# Patient Record
Sex: Male | Born: 1945 | Race: White | Hispanic: No | Marital: Married | Smoking: Former smoker
Health system: Southern US, Community
[De-identification: ages and names within clinical notes are randomized; demographics above are authoritative.]

## PROBLEM LIST (undated history)

## (undated) DIAGNOSIS — Z923 Personal history of irradiation: Secondary | ICD-10-CM

## (undated) DIAGNOSIS — C029 Malignant neoplasm of tongue, unspecified: Secondary | ICD-10-CM

## (undated) DIAGNOSIS — C099 Malignant neoplasm of tonsil, unspecified: Secondary | ICD-10-CM

## (undated) HISTORY — DX: Personal history of irradiation: Z92.3

## (undated) HISTORY — PX: GASTROSTOMY TUBE PLACEMENT: SHX655

## (undated) HISTORY — PX: HERNIA REPAIR: SHX51

---

## 2013-04-17 DIAGNOSIS — C099 Malignant neoplasm of tonsil, unspecified: Secondary | ICD-10-CM

## 2013-04-17 HISTORY — DX: Malignant neoplasm of tonsil, unspecified: C09.9

## 2013-05-10 ENCOUNTER — Ambulatory Visit: Payer: Medicare Other | Admitting: Radiation Oncology

## 2013-12-18 ENCOUNTER — Emergency Department (HOSPITAL_COMMUNITY): Payer: Medicare Other

## 2013-12-18 ENCOUNTER — Emergency Department (HOSPITAL_COMMUNITY)
Admission: EM | Admit: 2013-12-18 | Discharge: 2013-12-18 | Disposition: A | Discharge status: Home / Self Care | Payer: Medicare Other | Attending: Emergency Medicine | Admitting: Emergency Medicine

## 2013-12-18 ENCOUNTER — Encounter (HOSPITAL_COMMUNITY): Payer: Self-pay | Admitting: Emergency Medicine

## 2013-12-18 DIAGNOSIS — H6990 Unspecified Eustachian tube disorder, unspecified ear: Secondary | ICD-10-CM | POA: Insufficient documentation

## 2013-12-18 DIAGNOSIS — H6982 Other specified disorders of Eustachian tube, left ear: Secondary | ICD-10-CM

## 2013-12-18 DIAGNOSIS — R221 Localized swelling, mass and lump, neck: Secondary | ICD-10-CM

## 2013-12-18 DIAGNOSIS — K148 Other diseases of tongue: Secondary | ICD-10-CM

## 2013-12-18 DIAGNOSIS — I10 Essential (primary) hypertension: Secondary | ICD-10-CM | POA: Diagnosis not present

## 2013-12-18 DIAGNOSIS — J029 Acute pharyngitis, unspecified: Secondary | ICD-10-CM | POA: Insufficient documentation

## 2013-12-18 DIAGNOSIS — H6980 Other specified disorders of Eustachian tube, unspecified ear: Secondary | ICD-10-CM | POA: Diagnosis not present

## 2013-12-18 DIAGNOSIS — R220 Localized swelling, mass and lump, head: Secondary | ICD-10-CM | POA: Insufficient documentation

## 2013-12-18 DIAGNOSIS — Z87891 Personal history of nicotine dependence: Secondary | ICD-10-CM | POA: Insufficient documentation

## 2013-12-18 NOTE — ED Provider Notes (Signed)
CSN: [id number]     Arrival date & time 12/18/13  0944 History   First MD Initiated Contact with Patient 12/18/13 1024     Chief Complaint  Patient presents with  . Cough  . Sore Throat     (Consider location/radiation/quality/duration/timing/severity/associated sxs/prior Treatment) HPI Comments: Sean Hopkins is a(n) 68 y.o. male who presents to the ED with cc of ear pain, sore throat, and tongue pain and swelling. The patietn noticed swelling at the distal encd of his tongoe on the left side that began 3 weeks ago. He states that the tongue has slowly enlarged and become more painful. He has associated pain with swallowing against the left tonsillar noded. He has had associated intermittent fullness and decreased hearing on the Left which clears with swallowing. He deneis smoking history, He denies hx of htn. No pcp, no meds. He has associated difficulty with speech due to the swellin in his tongue. Denies fevers, chills, fatigue, night sweats, unexplained weight loss. Denies unilateral weakness, facial asymmetry, difficulty with speech, change in gait, or vertigo.     Patient is a 68 y.o. male presenting with pharyngitis.  Sore Throat    History reviewed. No pertinent past medical history. Past Surgical History  Procedure Laterality Date  . Hernia repair     History reviewed. No pertinent family history. History  Substance Use Topics  . Smoking status: Former Research scientist (life sciences)  . Smokeless tobacco: Not on file  . Alcohol Use: Yes     Comment: social    Review of Systems   Ten systems reviewed and are negative for acute change, except as noted in the HPI.     Allergies  Review of patient's allergies indicates no known allergies.  Home Medications   Prior to Admission medications   Medication Sig Start Date End Date Taking? Authorizing Provider  ibuprofen (ADVIL,MOTRIN) 200 MG tablet Take 800 mg by mouth every 6 (six) hours as needed for moderate pain.   Yes Historical Provider,  MD   BP 183/85  Pulse 74  Temp(Src) 98.2 F (36.8 C) (Oral)  Resp 16  SpO2 98% Physical Exam  Nursing note and vitals reviewed. Constitutional: He appears well-developed and well-nourished. No distress.  HENT:  Head: Normocephalic and atraumatic.  TMs normal bilaterally, no mastoid tenderness, no discharge, no auricular adenopathy.  coated tongue, painful swelling, left distal end.  Eyes: Conjunctivae are normal. No scleral icterus.  Neck: Normal range of motion. Neck supple.  Cardiovascular: Normal rate, regular rhythm and normal heart sounds.   Pulmonary/Chest: Effort normal and breath sounds normal. No respiratory distress.  Abdominal: Soft. There is no tenderness.  Musculoskeletal: He exhibits no edema.  Neurological: He is alert.  Skin: Skin is warm and dry. He is not diaphoretic.  Psychiatric: His behavior is normal.    ED Course  Procedures (including critical care time) Labs Review Labs Reviewed - No data to display  Imaging Review Dg Chest 2 View  12/18/2013   CLINICAL DATA:  Sore throat.  Some cough.  EXAM: CHEST  2 VIEW  COMPARISON:  None.  FINDINGS: Cardiac silhouette is normal in size and configuration. Aorta is uncoiled. No mediastinal or hilar masses. No evidence of adenopathy.  Clear lungs.  No pleural effusion or pneumothorax.  Bony thorax is demineralized but intact.  IMPRESSION: No active cardiopulmonary disease.   Electronically Signed   By: Lajean Manes M.D.   On: 12/18/2013 10:30     EKG Interpretation None      MDM  Final diagnoses:  None   10:47 AM BP 183/85  Pulse 74  Temp(Src) 98.2 F (36.8 C) (Oral)  Resp 16  SpO2 98% Patient hypertensive, and swelling at the distal end of the tongue on the left. I have called [facility] Junes and is in lived and have obtained an appointment for this patient at 3:30 PM tomorrow with Dr. Wilburn Cornelia for assessment. Oropharynx is clear and moist. Does not appear to be any signs of abscess or ludwigs  angina. Patient seen in shared visit with attending physician. Discussed return precautions Followup with ear nose and throat regarding tongue mass and pcp regarding hypertension     Margarita Mail, PA-C 12/19/13 [phone]

## 2013-12-18 NOTE — ED Notes (Signed)
Per pt, cough x 1 week.  Earache x 3-4 weeks.  Tongue pain 3-4 weeks.  Pt states when he coughs, he has blood in his sputum.  Lots of mucous.  This started yesterday.  Unknown for fevers.  No n/v.  Eating/drinking like normal.

## 2013-12-18 NOTE — Discharge Instructions (Signed)
Read the instructions below on reasons to return to the emergency department and to learn more about your diagnosis.  Use over the counter medications for symptomatic relief as we discussed (musinex as a decongestant, Tylenol for fever/pain, Motrin/Ibuprofen for muscle aches). If prescribed a cough suppressant during your visit, do not operate heavy machinery with in 5 hours of taking this medication. Followup with your primary care doctor in 4 days if your symptoms persist.  Your more than welcome to return to the emergency department if symptoms worsen or become concerning.  Upper Respiratory Infection, Adult  An upper respiratory infection (URI) is also sometimes known as the common cold. Most people improve within 1 week, but symptoms can last up to 2 weeks. A residual cough may last even longer.   URI is most commonly caused by a virus. Viruses are NOT treated with antibiotics. You can easily spread the virus to others by oral contact. This includes kissing, sharing a glass, coughing, or sneezing. Touching your mouth or nose and then touching a surface, which is then touched by another person, can also spread the virus.   TREATMENT  Treatment is directed at relieving symptoms. There is no cure. Antibiotics are not effective, because the infection is caused by a virus, not by bacteria. Treatment may include:  Increased fluid intake. Sports drinks offer valuable electrolytes, sugars, and fluids.  Breathing heated mist or steam (vaporizer or shower).  Eating chicken soup or other clear broths, and maintaining good nutrition.  Getting plenty of rest.  Using gargles or lozenges for comfort.  Controlling fevers with ibuprofen or acetaminophen as directed by your caregiver.  Increasing usage of your inhaler if you have asthma.  Return to work when your temperature has returned to normal.   SEEK MEDICAL CARE IF:  After the first few days, you feel you are getting worse rather than better.  You  develop worsening shortness of breath, or brown or red sputum. These may be signs of pneumonia.  You develop yellow or brown nasal discharge or pain in the face, especially when you bend forward. These may be signs of sinusitis.  You develop a fever, swollen neck glands, pain with swallowing, or white areas in the back of your throat. These may be signs of strep throat.

## 2013-12-18 NOTE — ED Provider Notes (Signed)
Pt has had painful swelling of his left tongue for the past 2-3 weeks, denies smoking.    Pt has swelling and change in texture of the distal left tongue. See photo.    Medical screening examination/treatment/procedure(s) were conducted as a shared visit with non-physician practitioner(s) and myself.  I personally evaluated the patient during the encounter.   EKG Interpretation None       Rolland Porter, MD, Abram Sander   Janice Norrie, MD 12/18/13 [phone]

## 2013-12-19 NOTE — ED Provider Notes (Signed)
Medical screening examination/treatment/procedure(s) were performed by non-physician practitioner and as supervising physician I was immediately available for consultation/collaboration.   EKG Interpretation None      Rolland Porter, MD, Abram Sander   Janice Norrie, MD 12/19/13 [phone]

## 2013-12-24 ENCOUNTER — Other Ambulatory Visit: Payer: Self-pay | Admitting: Otolaryngology

## 2013-12-24 DIAGNOSIS — K140 Glossitis: Secondary | ICD-10-CM

## 2014-01-04 ENCOUNTER — Ambulatory Visit
Admission: RE | Admit: 2014-01-04 | Discharge: 2014-01-04 | Disposition: A | Discharge status: Home / Self Care | Payer: Medicare Other | Source: Ambulatory Visit | Attending: Otolaryngology | Admitting: Otolaryngology

## 2014-01-04 DIAGNOSIS — K140 Glossitis: Secondary | ICD-10-CM

## 2014-01-04 MED ORDER — IOHEXOL 300 MG/ML  SOLN
75.0000 mL | Freq: Once | INTRAMUSCULAR | Status: AC | PRN
  Administered 2014-01-04: 75 mL via INTRAVENOUS

## 2014-01-23 ENCOUNTER — Emergency Department: Payer: Self-pay | Admitting: Emergency Medicine

## 2014-02-15 HISTORY — PX: OTHER SURGICAL HISTORY: SHX169

## 2014-03-11 ENCOUNTER — Telehealth: Payer: Self-pay | Admitting: *Deleted

## 2014-03-11 NOTE — Telephone Encounter (Signed)
Called pt to introduce myself as the oncology nurse navigator that works with Drs. Isidore Moos and Alvy Bimler, indicated they will be seeing him during Wednesday's [facility] [place].  I explained the [place] format, confirmed his arrival for 1:00, his understanding of [place] location, explained the arrival and registration procedure.  I indicated that I would be joining him during his visit and would tell him more about my role as a member of the Care Team when we meet.  He verbalized understanding of information provided.  Gayleen Orem, RN, BSN, [facility] [place] [phone]

## 2014-03-12 ENCOUNTER — Encounter: Payer: Self-pay | Admitting: Radiation Oncology

## 2014-03-12 NOTE — Progress Notes (Signed)
Head and Neck Cancer Location of Tumor / Histology: oropharynx, Right Lingual Tonsil  Mr. Neng Albee was initially seen By Dr. Wilburn Cornelia, then referred to Dr. Nicolette Bang on 01/23/14.  He states that he has had pain in the anterior tongue for about 1-2 months. There is an ulceration present. This has worsened over this time. No otalgia.Voice change, dysphagia. Difficulty eating/chewing, 20-lb wt loss. No loose teeth, although most have been removed previously. Some numbness of left lower lip. Some bleeding. No palpable neck masses  Biopsy was performed but no malignancy seen. CT done showing suspicious tongue mass and poss contralateral LAD. Also thyroid mass  No family h/o thyroid disease, no radiation exposure.   Biopsies of Right LIngual Tonsil (if applicable) revealed:  RECEIVED: 02/15/2014 ORDERING PHYSICIAN: JOSHUA Tommas Olp , MD PATIENT NAME: Sean Hopkins SURGICAL PATHOLOGY REPORT  FINAL PATHOLOGIC DIAGNOSIS MICROSCOPIC EXAMINATION AND DIAGNOSIS  A. OROPHARYNX, RIGHT LINGUAL TONSIL, BIOPSY: Benign lingual tonsil. No malignancy identified.  B. LYMPH NODES, LEFT LEVEL 2A/3, DISSECTION: No malignancy identified in twenty one lymph nodes (0/21).  C. LYMPH NODES, LEFT LEVEL 2B, DISSECTION: No malignancy identified in eleven lymph nodes (0/11).  D. 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 Greatest dimension of largest positive node 2.5 cm. Negative for extracapsular extension. Benign submandibular gland.  E. SUBMANDIBULAR CONTENTS, RIGHT, EXCISION: Benign submandibular gland with sialolith. No malignancy identified.  F. OROPHARYNX, ANTERIOR TONGUE AND FLOOR OF MOUTH, PARTIAL GLOSSECTOMY: Invasive moderately differentiated squamous cell carcinoma. Greatest dimension 4.5 cm. Positive for perineural invasion. No lymphovascular invasion identified. Margins negative for malignancy. pT3, pN1, pMX. See  protocol below.  G. LYMPH NODES, RIGHT LEVEL 2A/3, DISSECTION: No malignancy identified in nine lymph nodes (0/9).  H. SUBMANDIBULAR CONTENTS, LEFT, DISSECTION: Benign salivary gland with no diagnostic abnormality. No malignancy identified in two lymph nodes (0/2).  GROSS EXAMINATION AND DIAGNOSIS  I. TEETH, GROSS ONLY: Gross examination only (see comment).    Nutrition Status Yes No Comments  Weight changes? [x]  []  20 lb weight loss a of 01/23/14  Swallowing concerns? [x]  []  Dysphagia/difficulty chewing  PEG? []  [x]  Eating soft diet/ 5-7 Cans of Boost daily   Referrals Yes No Comments  Social Work? []  []    Dentistry? []  []    Swallowing therapy? []  []    Nutrition? [x]  []  Dory Peru - 03/13/14  Med/Onc? [x]  []  Dr. Heath Lark -03/13/14   Safety Issues Yes No Comments  Prior radiation? []  [x]    Pacemaker/ICD? []  [x]    Possible current pregnancy? []  [x]    Is the patient on methotrexate? []  [x]     Tobacco/Marijuana/Snuff/ETOH use: Alcohol use: reports that he drinks alcohol. Rare.  Tobacco: The patient quit smoking at age 2 after a 86 pack-year history.   Past/Anticipated interventions by otolaryngology, if any: Bi[facility] - Dr. Theodoro Kalata: Biopsy of Right Lingual Tonsil  Past/Anticipated interventions by medical oncology, if any: Dr. Heath Lark    Current Complaints / other details:  Yellowish residue on tongue No chief complaint on file.

## 2014-03-13 ENCOUNTER — Encounter: Payer: Self-pay | Admitting: Hematology and Oncology

## 2014-03-13 ENCOUNTER — Encounter: Payer: Self-pay | Admitting: *Deleted

## 2014-03-13 ENCOUNTER — Ambulatory Visit: Payer: Medicare Other

## 2014-03-13 ENCOUNTER — Telehealth: Payer: Self-pay | Admitting: *Deleted

## 2014-03-13 ENCOUNTER — Ambulatory Visit: Payer: Medicare Other | Admitting: Hematology and Oncology

## 2014-03-13 ENCOUNTER — Ambulatory Visit: Payer: Medicare Other | Admitting: Nutrition

## 2014-03-13 ENCOUNTER — Encounter: Payer: Self-pay | Admitting: Radiation Oncology

## 2014-03-13 ENCOUNTER — Ambulatory Visit
Admission: RE | Admit: 2014-03-13 | Discharge: 2014-03-13 | Disposition: A | Discharge status: Home / Self Care | Payer: Medicare Other | Source: Ambulatory Visit | Attending: Radiation Oncology | Admitting: Radiation Oncology

## 2014-03-13 ENCOUNTER — Ambulatory Visit: Payer: Medicare Other | Attending: Radiation Oncology | Admitting: Physical Therapy

## 2014-03-13 VITALS — BP 146/58 | HR 79 | Temp 98.0°F | Ht 66.0 in | Wt 139.4 lb

## 2014-03-13 DIAGNOSIS — C029 Malignant neoplasm of tongue, unspecified: Secondary | ICD-10-CM | POA: Insufficient documentation

## 2014-03-13 DIAGNOSIS — C024 Malignant neoplasm of lingual tonsil: Secondary | ICD-10-CM

## 2014-03-13 DIAGNOSIS — M436 Torticollis: Secondary | ICD-10-CM | POA: Insufficient documentation

## 2014-03-13 DIAGNOSIS — C023 Malignant neoplasm of anterior two-thirds of tongue, part unspecified: Secondary | ICD-10-CM

## 2014-03-13 HISTORY — DX: Malignant neoplasm of tonsil, unspecified: C09.9

## 2014-03-13 MED ORDER — FLUCONAZOLE 100 MG PO TABS: ORAL_TABLET | ORAL | Status: DC

## 2014-03-13 NOTE — Progress Notes (Signed)
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 male who presented with Sean Hopkins was initially seen by Dr. Wilburn Cornelia, then referred to Dr. Nicolette Bang on 01/23/14.  He presented with pain in the anterior tongue for about 1-2 months. There is an ulceration present. This has worsened over this time. No otalgia. He reported difficulty eating/chewing, with 20-lb wt loss. No palpable neck masses  Biopsy was performed but no malignancy seen initially. CT done showing suspicious tongue mass and possible contralateral LAD. Also, a thyroid mass (which ultimately was determined not to need biopsy by the radiologists at [facility].)  He subsequently underwent:  01/23/14 [facility] ANTERIOR TONGUE, BIOPSY: Invasive squamous cell carcinoma  10/30 PET showed Intense FDG activity of the floor of the mouth and bilateral suspicious FDG activity in the neck nodes.  Surgery was then performed by Dr Nicolette Bang on 02/15/2014. Tissue graft was taken from left arm for tongue reconstruction.  Pathology revealed:  A. OROPHARYNX, RIGHT LINGUAL TONSIL, BIOPSY: Benign lingual tonsil. No malignancy identified.  B. LYMPH NODES, LEFT LEVEL 2A/3, DISSECTION: No malignancy identified in twenty one lymph nodes (0/21).  C. LYMPH NODES, LEFT LEVEL 2B, DISSECTION: No malignancy identified in eleven lymph nodes (0/11).  D. 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 Greatest dimension of largest positive node 2.5 cm. Negative for extracapsular extension. Benign submandibular gland.  E. SUBMANDIBULAR CONTENTS, RIGHT,  EXCISION: Benign submandibular gland with sialolith. No malignancy identified.  F. OROPHARYNX, ANTERIOR TONGUE AND FLOOR OF MOUTH, PARTIAL GLOSSECTOMY: Invasive moderately differentiated squamous cell carcinoma. Greatest dimension 4.5 cm. Positive for perineural invasion. No lymphovascular invasion identified. Margins negative for malignancy. pT3, pN1, pMX. See protocol below.  G. LYMPH NODES, RIGHT LEVEL 2A/3, DISSECTION: No malignancy identified in nine lymph nodes (0/9).  H. SUBMANDIBULAR CONTENTS, LEFT, DISSECTION: Benign salivary gland with no diagnostic abnormality. No malignancy identified in two lymph nodes (0/2).  GROSS EXAMINATION AND DIAGNOSIS  I. TEETH, GROSS ONLY: Gross examination only (see comment).   Plastic surgery: anticipated for left arm due to grafting. He quit smoking at age 66.  He drinks rare ETOH.  He lives in [place] with his wife and grandchild.  He notes drainage from neck surgical scar, for past week. No fevers.  Few complaints. No dysphagia with liquids, but doesn't try to eat solids. Edentulous.  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    PAST SURGICAL HISTORY: Past Surgical History  Procedure Laterality Date  . Hernia repair    . Tonsillar biopsy Right 02/15/14    Squamous Cell Carcinoma with 1/4 Lymph Nodes Positive    FAMILY HISTORY: family history is not on file.  SOCIAL HISTORY:  reports that he has quit smoking. He does not have any smokeless tobacco history on file. He reports that he drinks alcohol. He reports that he does not use illicit drugs.  ALLERGIES: Review of patient's allergies indicates no known allergies.  MEDICATIONS:  Current Outpatient Prescriptions  Medication Sig Dispense Refill  . aspirin 325 MG tablet Take 325 mg by mouth.    Marland Kitchen HYDROcodone-acetaminophen (  NORCO) 5-325 MG per tablet Take 1 tablet by mouth.    Marland Kitchen ibuprofen (ADVIL,MOTRIN)  200 MG tablet Take 800 mg by mouth every 6 (six) hours as needed for moderate pain.    . Skin Protectants, Misc. (EUCERIN) cream Apply to affected area twice daily    . fluconazole (DIFLUCAN) 100 MG tablet Take 2 tablets today, then 1 tablet daily x 20 more days. 22 tablet 0  . terazosin (HYTRIN) 1 MG capsule Take 1 mg by mouth.     No current facility-administered medications for this encounter.    REVIEW OF SYSTEMS:  Notable for that above.   PHYSICAL EXAM:  height is 5\' 6"  (1.676 m) and weight is 139 lb 6.4 oz (63.231 kg). His temperature is 98 F (36.7 C). His blood pressure is 146/58 and his pulse is 79. His oxygen saturation is 99%.   General: Alert and oriented, in no acute distress HEENT: Head is normocephalic. Extraocular movements are intact. Edentulous. Oropharynx is notable for thrush.  Reconstructed tongue with tissue graft. Neck: No palpable cervical or supraclavicular lymphadenopathy. Culture/swab conducted of yellow drainage from surgical incision over chin.  Induration/erythema of central neck. Trach site closing. Heart: Regular in rate and rhythm with no murmurs, rubs, or gallops. Chest: Clear to auscultation bilaterally, with no rhonchi, wheezes, or rales. Abdomen: Soft, nontender, nondistended, with no rigidity or guarding. Extremities: No cyanosis or edema. Lymphatics: see Neck Exam Skin: No concerning lesions. Musculoskeletal: symmetric strength and muscle tone throughout. Neurologic: Cranial nerves II through XII are grossly intact. No obvious focalities. Speech is fluent. Coordination is intact. Psychiatric: Judgment and insight are intact. Affect is appropriate.  ECOG = 1  0 - Asymptomatic (Fully active, able to carry on all predisease activities without restriction)  1 - Symptomatic but completely ambulatory (Restricted in physically strenuous activity but ambulatory and able to carry out work of a light or sedentary nature. For example, light housework, office  work)  2 - Symptomatic, <50% in bed during the day (Ambulatory and capable of all self care but unable to carry out any work activities. Up and about more than 50% of waking hours)  3 - Symptomatic, >50% in bed, but not bedbound (Capable of only limited self-care, confined to bed or chair 50% or more of waking hours)  4 - Bedbound (Completely disabled. Cannot carry on any self-care. Totally confined to bed or chair)  5 - Death   Eustace Pen MM, Creech RH, Tormey DC, et al. [id number]). "Toxicity and response criteria of the [facility]". [place] Oncol. 5 (6): 649-55   LABORATORY DATA:  No results found for: WBC, HGB, HCT, MCV, PLT CMP  No results found for: NA, K, CL, CO2, GLUCOSE, BUN, CREATININE, CALCIUM, PROT, ALBUMIN, AST, ALT, ALKPHOS, BILITOT, GFRNONAA, GFRAA     PATHOLOGY: 02/15/14 [place]   RECEIVED: 02/15/2014 ORDERING PHYSICIAN: JOSHUA Tommas Olp , MD PATIENT NAME: Sean Hopkins SURGICAL PATHOLOGY REPORT  FINAL PATHOLOGIC DIAGNOSIS MICROSCOPIC EXAMINATION AND DIAGNOSIS  A. OROPHARYNX, RIGHT LINGUAL TONSIL, BIOPSY: Benign lingual tonsil. No malignancy identified.  B. LYMPH NODES, LEFT LEVEL 2A/3, DISSECTION: No malignancy identified in twenty one lymph nodes (0/21).  C. LYMPH NODES, LEFT LEVEL 2B, DISSECTION: No malignancy identified in eleven lymph nodes (0/11).  D. 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 Greatest dimension of largest positive node 2.5 cm. Negative for extracapsular extension. Benign submandibular gland.  E. SUBMANDIBULAR CONTENTS, RIGHT, EXCISION: Benign submandibular gland  with sialolith. No malignancy identified.  F. OROPHARYNX, ANTERIOR TONGUE AND FLOOR OF MOUTH, PARTIAL GLOSSECTOMY: Invasive moderately differentiated squamous cell carcinoma. Greatest dimension 4.5 cm. Positive for perineural invasion. No lymphovascular invasion  identified. Margins negative for malignancy. pT3, pN1, pMX. See protocol below.  G. LYMPH NODES, RIGHT LEVEL 2A/3, DISSECTION: No malignancy identified in nine lymph nodes (0/9).  H. SUBMANDIBULAR CONTENTS, LEFT, DISSECTION: Benign salivary gland with no diagnostic abnormality. No malignancy identified in two lymph nodes (0/2).  GROSS EXAMINATION AND DIAGNOSIS  I. TEETH, GROSS ONLY: Gross examination only (see comment).   COMMENT: I. In order to release the pathology report, part "I" will be examined and any additional information will be provided as an addendum (see below).   LIP AND ORAL CANCER PROTOCOL  SPECIMEN: Anterior two-thirds of tongue, NOS Floor of mouth, NOS RECEIVED: Fresh PROCEDURE: Resection Glossectomy: Partial (anterior) SPECIMEN SIZE: Greatest dimensions: 6.5 x 5.3 x 3.8 cm SPECIMEN LATERALITY: Midline TUMOR SITE: Lateral border of tongue TUMOR FOCALITY: Single focus TUMOR SIZE: Greatest dimension: 4.5 cm Additional dimensions: 2.6 x 2.5 cm HISTOLOGIC TYPE: Squamous cell carcinoma, conventional HISTOLOGIC GRADE: G2 MARGINS: Margins uninvolved by invasive carcinoma TREATMENT EFFECT: Not identified LYMPH-VASCULAR INVASION: Not identified PERINEURAL INVASION: Present LYMPH NODES, EXTRANODAL EXTENSION: Not identified PATHOLOGIC STAGING (pTNM) (Note I) PRIMARY TUMOR (pT) Excluding Mucosal Malig Melanoma: pT3 REG LYMPH NODES (pN) Excluding Mucosal Malig Melanoma: pN1  01/23/14 [place] ANTERIOR TONGUE, BIOPSY: Invasive squamous cell carcinoma   RADIOGRAPHY: No results found.   PET at [facility] on 02-08-14:   Intense FDG activity of the floor of the mouth a patient with a diagnosis of squamous cell carcinoma of the tongue.  FDG avid right level Ib, and left level 2 lymph nodes concerning for metastatic disease.  There is low level activity in the jugular chains bilaterally at levels 2/3, deep to the SCM, right greater than left. This FDG activity  level is greater than that of blood pool, therefore the activity may represent disease in small nodes.  Intense focal uptake of FDG in the prostate eccentrically left to the prostatic urethra. Differential considerations include a TURP defect versus prostate carcinoma. Recommend correlation with surgical history and PSA value.   There is also circumferential mural thickening of the rectum, and the patient may benefit from digital rectal exam.  Hyperdense lesion arising from the upper pole of the right kidney without associated FDG activity. This finding may represent a hemorrhagic cyst, but is incompletely evaluated. Recommend further evaluation with MR abdomen.  IMPRESSION/PLAN: This is a delightful 68 year old man with stage III squamous cell carcinoma of the oral tongue, pT3N1M0, with PNI.  He has a very distant smoking history. The patient is a good candidate for adjuvant radiotherapy. The patient has been discussed in detail at tumor board and seen in the context of multidisciplinary clinic today. Plan is as below:   1) Today in multidisciplinary clinic the patient will see Polo Riley from social work for social support  2 Today in multidisciplinary clinic he will see nutrition for nutrition support - I anticipate PEG tube.   3) I will refer to IR for PEG tube placement. He is thin, and risk of malnutrition during mouth/ neck RT is significant.  4) Will refer to swallowing therapy for evaluation and prophylactic treatment as needed for dysphagia, which can worsen during or after chemoradiotherapy.   5) Physical therapist will see patient today in [facility] clinic for neck measurements due to risk of lymphedema in neck; may benefit from  PT for this after completion of radiotherapy. The patient also may benefit from PT for potentional deconditioning after treatment    6) Simulation scheduled for 12/7. Anticipate 6 weeks of RT - 60 Gy in 30 fractions.   7) Baseline TSH lab due to risk of  hypothyroidism from RT. Baseline BMP, CBC.  8) Fluconazole Rx'd for thrush  9) Culture/swab of yellow drainage from surgical incision over chin.  Induration/erythema of neck.  Gayleen Orem, RN, our Head and Neck Oncology Navigator called Dr. Servando Salina office to request that pt be seen this week in his clinic for suspected infection.  10) Need to obtain outside PET images for tx planning   It was a pleasure meeting the patient today. We discussed the risks, benefits, and side effects of radiotherapy.   We talked in detail about acute and late effects. He understands that some of the most bothersome acute effects will be significant soreness of the mouth and throat, changes in taste, changes in salivary function, skin irritation, hair loss, dehydration, weight loss and fatigue. We talked about late effects which include but are not necessarily limited to dysphagia, hypothyroidism, dry mouth, trismus, neck edema and nerve or spinal cord injury. No guarantees of treatment were given. A consent form was signed and placed in the patient's medical record. The patient is enthusiastic about proceeding with treatment. I look forward to participating in the patient's care.  _________________________________________   Eppie Gibson, MD

## 2014-03-13 NOTE — Progress Notes (Signed)
As requested by Dr. Isidore Moos, obtained culture from the underside of Sean Hopkins chin(beard) which demonstrated yellowish/opaque drainage.  Cleansed the area with sterile saline and note a small raise, raw, reddened area.  Culture taken to lab. Given sterile saline and steril aguze to cleanse the area

## 2014-03-13 NOTE — Therapy (Signed)
[place], [place], [address] Phone: [phone]   Fax:  [phone]  Physical Therapy Evaluation  Patient Details  Name: Sean Hopkins MRN: [id number] Date of Birth: 1945-05-02  Encounter Date: 03/13/2014      PT End of Session - 03/13/14 1359    Visit Number 1   Number of Visits 1   Date for PT Re-Evaluation 05/12/14   PT Start Time 1240   PT Stop Time 1310   PT Time Calculation (min) 30 min      Past Medical History  Diagnosis Date  . 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 Hernia repair    . 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  Visit Diagnosis:  Stiffness of neck - Plan: PT plan of care cert/re-cert      Subjective Assessment - 03/13/14 1340    Symptoms Still with some neck tightness post neck dissection surgery.   Pertinent History Left tongue inflamed squamous mucosa; oral tongue resection 02/15/14 at [facility]; neck lymph node dissection with approx. 20 nodes removed; one contralateral submental node positive, no lymphovascular invasion, but perineural invasion.         Currently in Pain? No/denies          [facility] PT Assessment - 03/13/14 0001    Assessment   Medical Diagnosis s/p tongue and lymph node dissection   Next MD Visit meets with radiation oncologist today   Precautions   Precautions Other (comment)   Precaution Comments cancer precautions; may have a neck infection   Required Braces or Orthoses Other Brace/Splint   Other Brace/Splint wearing a splint on his left hand and forearm which he reports is for donor sites for his tongue reconstruction; expects to have this removed next week   Restrictions   Weight Bearing Restrictions No   Balance Screen   Has the patient fallen in the past 6 months No   Has the patient had a decrease in  activity level because of a fear of falling?  No   Is the patient reluctant to leave their home because of a fear of falling?  No   Home Environment   Living Enviornment Private residence   Living Arrangements Spouse/significant other  also 36 year-old grandchild at home   Type of [place] Two level  denies difficulty with negotiating stairs   Prior Function   Level of Independence Independent with basic ADLs;Independent with gait   Observation/Other Assessments   Skin Integrity upper neck appears reddened and there is a discharge from the chin; appears to have swelling currently above the line of his neck dissection incision   Functional Tests   Functional tests Sit to Stand   Sit to Stand   Comments performs 12 reps in 30 seconds   AROM   Overall AROM  Deficits   Overall AROM Comments Active left shoulder flexion and abduction approx. 20 degrees less than right (WFL on right)   Cervical Flexion WFL   Cervical Extension 25% loss   Cervical - Right Side Bend 25% loss   Cervical - Left Side Bend 25% loss   Cervical - Right Rotation 25% loss   Cervical - Left Rotation 25% loss   Strength   Overall Strength Within functional limits for tasks performed   Overall  Strength Comments but patient reports feeling he is still weak post surgery            PT Education - March 27, 2014 1359    Education provided Yes   Education Details cervical ROM, posture, walking, lymphedema info for head and neck cancer pre-radiation treatment   Person(s) Educated Patient   Methods Explanation;Handout   Comprehension Verbalized understanding              Plan - March 27, 2014 1400    Clinical Impression Statement Pt. s/p neck dissection and to undergo radiation treatment with current neck AROM limitations and risk of lymphedema may benefit from therapy if these conditions persist/occur with radiation treatment.   Pt will benefit from skilled therapeutic intervention in order to improve  on the following deficits Increased edema;Decreased range of motion   Rehab Potential Good   PT Frequency One time visit   PT Treatment/Interventions Patient/family education;Therapeutic exercise   PT Next Visit Plan None at this time; reassess if lymphedema or other limitations develop.   Consulted and Agree with Plan of Care Patient          G-Codes - 03/27/2014 1403    Functional Assessment Tool Used clinical judgement   Functional Limitation Changing and maintaining body position   Changing and Maintaining Body Position Current Status [phone]) At least 20 percent but less than 40 percent impaired, limited or restricted   Changing and Maintaining Body Position Goal Status (E4235) At least 1 percent but less than 20 percent impaired, limited or restricted            LYMPHEDEMA/ONCOLOGY QUESTIONNAIRE - 03-27-14 1356    Type   Cancer Type neck   Surgeries   Other Surgery Date 02/15/14  tongue resection and reconstruction; neck node dissection   Number Lymph Nodes Removed 20   Treatment   Active Radiation Treatment No  will start radiation treatment soon   Lymphedema Assessments   Lymphedema Assessments Head and Neck   Head and Neck   4 cm superior to sternal notch around neck 36.5 cm   6 cm superior to sternal notch around neck 36.1 cm   8 cm superior to sternal notch around neck 38 cm   Other 10 cm. superior to sternal notch, 39.3 cm.                         Head and Neck Clinic Goals - Mar 27, 2014 1403    Patient will be able to verbalize understanding of a home exercise program for cervical range of motion, posture, and walking.    Status Achieved   Patient will be able to verbalize understanding of proper sitting and standing posture.    Status Achieved   Patient will be able to verbalize understanding of lymphedema risk and availability of treatment for this condition.    Status Achieved       Problem List Patient Active Problem  List   Diagnosis Date Noted  . Tongue cancer March 27, 2014    SALISBURY,DONNA 2014/03/27, 2:07 PM     SALISBURY,DONNA, PT

## 2014-03-13 NOTE — Telephone Encounter (Signed)
Spoke with Roanna Epley at Dr. Tressa Busman office.  Reported that during patient's visit with Dr. Isidore Moos today, it was noted patient experiencing surgical site redness with some discharge that was cultured.  I expressed Dr. Pearlie Oyster concern that patient should be seen this week for further assessment.  Roanna Epley stated that she would forward information to Dr. Servando Salina RN who would contact pt to arrange appt.  Gayleen Orem, RN, BSN, [place] at [place] [phone]

## 2014-03-13 NOTE — Progress Notes (Addendum)
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  He is a patient of Dr. Isidore Moos.  He is to receive radiation therapy.  Past medical history includes tobacco and alcohol use.  Medications include Norco.  Labs were not available.  Height: 66 inches. Weight: 139.4 pounds. Usual body weight: 140 pounds per patient.Marland Kitchen BMI: 22.5.  Patient reports he has no difficulty chewing if he consume soft foods.   Patient does have poor dentition.   Patient reports he has lost weight however, reports usual body weight of 140 pounds.   Patient does not drink milk nor does he like ensure or boost.  Patient states he makes himself drink oral nutrition supplements.  Nutrition diagnosis:  Food and nutrition related knowledge deficit related to new diagnosis of tonsil cancer as evidenced by no prior need for nutrition related information.  Intervention: Patient educated to consume high-calorie, high-protein foods in small amounts throughout the day. Provided fact sheet on increasing calories and protein. Provided coupons for oral nutrition supplements and encouraged patient to try some other brands. Provided recipes fact sheets for patient and encouraged patient use oral nutrition supplements to prepare milkshakes. Teach back method was used. Contact information was provided.  Monitoring, evaluation, goals: Patient will tolerate adequate calories and protein to minimize weight loss throughout treatment.  Next visit: To be scheduled with radiation therapy.  **Disclaimer: This note was dictated with voice recognition software. Similar sounding words can inadvertently be transcribed and this note may contain transcription errors which may not have been corrected upon publication of note.**

## 2014-03-13 NOTE — Progress Notes (Signed)
Met with patient during [place] today: 1. Further explained my role as his navigator. 2. Provided New Patient Information packet:  Contact information for physician and navigator  Advance Directive information ([place] blue pamphlet)  Fall Prevention Patient Safety Plan  [facility] campus map with highlight of [place] 3. Provided preliminary education re: RT, including showing of mask and explanation of its purpose during RT. 4. Supported Dr. Pearlie Oyster discussion of feeding tube, provided him educational handout. 5. Provided tour of CT SIM and [place], explained procedure for arrival/prepartion for RT. He verbalized understanding of information provided.  He understands he can contact met at any time with questions/concerns.  Gayleen Orem, RN, BSN, [place] at [place] [phone]

## 2014-03-13 NOTE — Telephone Encounter (Signed)
Called patient to see if he had any questions prior to his arrival for [facility].  He denied.  I reminded him of his 12:00 arrival time, reviewed the arrival/registration process.  He verbalized understanding.  Gayleen Orem, RN, BSN, [place] at [place] [phone]

## 2014-03-14 NOTE — Progress Notes (Signed)
Head & Neck Multidisciplinary Clinic Clinical Social Work  Clinical Social Work met with patient at head & neck multidisciplinary clinic to offer support and assess for psychosocial needs.  Mr. Brusca reported he had no concerns at today's visit.  The patient shared his brother could provide transportation to appointments if necessary.  Mr. Tokunaga was unable to identify his support system at this time.   Clinical Social Work briefly discussed Clinical Social Work role and [employer] support programs/services.  Clinical Social Work encouraged patient to call with any additional questions or concerns.   Polo Riley, MSW, LCSW, OSW-C Clinical Social Worker [facility] [phone]

## 2014-03-15 ENCOUNTER — Telehealth: Payer: Self-pay | Admitting: *Deleted

## 2014-03-15 NOTE — Telephone Encounter (Signed)
CALLED PATIENT TO INFORM OF APPT. WITH [facility] [place] ON 04-01-14 - ARRIVAL TIME - 1:45 PM @ [facility] , LVM FOR A RETURN CALL

## 2014-03-16 LAB — WOUND CULTURE

## 2014-03-18 ENCOUNTER — Telehealth: Payer: Self-pay | Admitting: *Deleted

## 2014-03-18 ENCOUNTER — Ambulatory Visit
Admission: RE | Admit: 2014-03-18 | Discharge: 2014-03-18 | Disposition: A | Discharge status: Home / Self Care | Payer: Medicare Other | Source: Ambulatory Visit | Attending: Radiation Oncology | Admitting: Radiation Oncology

## 2014-03-18 VITALS — BP 144/66 | HR 79 | Temp 97.8°F | Ht 66.0 in | Wt 137.4 lb

## 2014-03-18 DIAGNOSIS — Z510 Encounter for antineoplastic radiation therapy: Secondary | ICD-10-CM | POA: Insufficient documentation

## 2014-03-18 DIAGNOSIS — C029 Malignant neoplasm of tongue, unspecified: Secondary | ICD-10-CM | POA: Diagnosis not present

## 2014-03-18 MED ORDER — SODIUM CHLORIDE 0.9 % IJ SOLN
10.0000 mL | Freq: Once | INTRAMUSCULAR | Status: AC
  Administered 2014-03-18: 10 mL via INTRAVENOUS

## 2014-03-18 NOTE — Progress Notes (Signed)
Patient  gave name and dob as identification, s/p Ct Simulation completed,patient tolerated well, stated; d/c Iv catheter right arm, catheter tip intact, placed gause over site,help pressure 2 minutes, gave instructions  To leave dresing over site a couple hours,gave bandaid to replace gause later, patient gave verbal understanding  11:29 AM

## 2014-03-18 NOTE — Telephone Encounter (Signed)
Called Dr. Servando Salina office at [facility] and spoke to [place].  Relayed the results of the wound culture of the posterior chin region and also faxed a copy of the report as requested at 1612.

## 2014-03-18 NOTE — Progress Notes (Signed)
Mr. Sean Hopkins here today for IV start prior to CT simulation.  He denies any contrast reaction in the past and is not a diabetic.  His last BUN and Creat. Are 15 and 0.62 respectively as of 02/18/14 at [facility].  IV start in right ventral surface with a 22 gauge angiocath with brisk blood return.  No voiced complaints.  Escorted to simulation at 10:45am

## 2014-03-18 NOTE — Progress Notes (Signed)
Simulation, IMRT treatment planning note   Outpatient  Diagnosis:    ICD-9-CM ICD-10-CM   1. 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 An Aquaplast head and shoulder mask was custom fitted to the patient's anatomy. High-resolution CT axial imaging was obtained of the head and neck with contrast. I verified that the quality of the imaging is good for treatment planning. 1 Medically Necessary Treatment Device was fabricated and supervised by me: Aquaplast mask.   Treatment planning note I plan to treat the patient with helical Tomotherapy, IMRT. I plan to treat the patient's tumor bed and bilateral neck nodes. I plan to treat to a total dose of 60 Gray in 30  Fractions. Dose calculation was ordered from dosimetry.  IMRT planning Note  IMRT is an important modality to deliver adequate dose to the patient's at risk tissues while sparing the patient's normal structures, including the: esophagus, parotid tissue, mandible, brain stem, spinal cord, oral cavity, brachial plexus.  This justifies the use of IMRT in the patient's treatment.      -----------------------------------  Eppie Gibson, MD

## 2014-03-19 ENCOUNTER — Telehealth: Payer: Self-pay | Admitting: *Deleted

## 2014-03-19 NOTE — Telephone Encounter (Signed)
Called patient to see if he had any questions s/p his visit with Dr. Isidore Moos last week; he denied.  We discussed his hesitation to have feeding tube placed, "I don't like the idea".  I explained the benefits, rationale for having tube placed before starting RT, timeframe for its removal.  I asked if he would like to speak with a former patient who had a feeding tube, he accepted offer.  I replied that I would get back to him with the name of the patient who will be calling him.  Gayleen Orem, RN, BSN, [place] at [place] [phone]

## 2014-03-19 NOTE — Telephone Encounter (Signed)
Called patient to let him know he can expect a call from former patient Rochele Raring with whom he can share his questions and concerns about having a feeding tube placed.  Gayleen Orem, RN, BSN, [place] at [place] [phone]

## 2014-03-22 ENCOUNTER — Telehealth: Payer: Self-pay | Admitting: *Deleted

## 2014-03-22 NOTE — Telephone Encounter (Signed)
Called patient s/p his call from former patient who shared his experience of having a feeding tube.  Sean Hopkins indicated to me he is "still not sure" about having one placed.  I confirmed his understanding of 11/16 0800 start date for his RT.  Gayleen Orem, RN, BSN, [place] at [place] [phone]

## 2014-03-25 DIAGNOSIS — Z510 Encounter for antineoplastic radiation therapy: Secondary | ICD-10-CM | POA: Diagnosis not present

## 2014-03-26 ENCOUNTER — Telehealth: Payer: Self-pay | Admitting: *Deleted

## 2014-03-26 NOTE — Telephone Encounter (Addendum)
Returned call from Sean Hopkins.  He reports that he has increased swelling in his posterior jaw and he does not want to start radiation until he is seen by Dr. Nicolette Bang.  Suggested he be seen by Dr. Wilburn Cornelia who was the original physician that saw him prior to referral to Dr. Nicolette Bang.   Dr. Wilburn Cornelia is able to see him today at 3:40pm, but Sean Hopkins states he would prefer to see Dr. Nicolette Bang, therefore, waiting on return call from Dr. Servando Salina office and will call Sean Hopkins with an update.

## 2014-03-27 ENCOUNTER — Encounter: Payer: Medicare Other | Admitting: Nutrition

## 2014-03-27 ENCOUNTER — Telehealth: Payer: Self-pay | Admitting: *Deleted

## 2014-03-27 ENCOUNTER — Ambulatory Visit: Admission: RE | Admit: 2014-03-27 | Payer: Medicare Other | Source: Ambulatory Visit | Admitting: Radiation Oncology

## 2014-03-27 DIAGNOSIS — Z510 Encounter for antineoplastic radiation therapy: Secondary | ICD-10-CM | POA: Diagnosis not present

## 2014-03-27 NOTE — Telephone Encounter (Signed)
Called patient, he confirmed he will be here tomorrow to start RT.  Dr. Isidore Moos and [facility] informed.  Gayleen Orem, RN, BSN, [place] at [place] [phone]

## 2014-03-28 ENCOUNTER — Ambulatory Visit
Admission: RE | Admit: 2014-03-28 | Discharge: 2014-03-28 | Disposition: A | Discharge status: Home / Self Care | Payer: Medicare Other | Source: Ambulatory Visit | Attending: Radiation Oncology | Admitting: Radiation Oncology

## 2014-03-28 DIAGNOSIS — C029 Malignant neoplasm of tongue, unspecified: Secondary | ICD-10-CM

## 2014-03-28 DIAGNOSIS — Z510 Encounter for antineoplastic radiation therapy: Secondary | ICD-10-CM | POA: Diagnosis not present

## 2014-03-28 MED ORDER — BIAFINE EX EMUL
Freq: Two times a day (BID) | CUTANEOUS | Status: DC
  Administered 2014-03-28: 11:00:00 via TOPICAL

## 2014-03-28 NOTE — Progress Notes (Signed)
SKIN CARE DURING RADIATION TREATMENT-HEAD AND NECK  RECOMMENDATIONS: ? Use unscented soap (Dove) ? When showering it is fine for water to touch the area, but please avoid direct spray on the treatment field.  Also, wash inside and around the marked area ? When drying gently blot the area ? Avoid using lotions, oils or powders as well as products with alcohol ? If you shave, use an electric razor and DO NOT use pre or after shave lotion  ? Moisturizer o You may be given Radiaplex Gel or Biafine (provided by nursing) to use. Apply twice daily, once after treatment and then again prior to bedtime o Your Radiation Oncologist may suggest other skin care products ? PLEASE DO NOT APPLY ANYTHING TO THE TREATMENT AREA SKIN WITH 4 HOURS  PRIOR TO RADIATION  ? Mouth care o Soothing relief: rinse your mouth ever 1-2 hours with a solution of  teaspoon baking soda and 1/8 teaspoon salt mixed in 1 cup of warm water o DO NOT use mouthwashes that contain alcohol, try using BIOTENE instead

## 2014-03-28 NOTE — Progress Notes (Signed)
Pt here for Patient teaching.  Pt given Radiation and You booklet with areas of pertinence flagged and highlighted.  Reviewed fatigue, hair loss, mouth and throat changes, mouth and skin care.  Pt given Biafine cream with instructions of how to apply and frequency. Given skin care information sheet with mouth rinse recipe (baking soda and salt combo).  Pt able to given pt teach back of how to apply biafine, to use an electric razor if shaving and keep mouth moist.  Pt states he feels comfortable with the information and if he has any questions or concerns to contact nursing.

## 2014-03-29 ENCOUNTER — Ambulatory Visit
Admission: RE | Admit: 2014-03-29 | Discharge: 2014-03-29 | Disposition: A | Discharge status: Home / Self Care | Payer: Medicare Other | Source: Ambulatory Visit | Attending: Radiation Oncology | Admitting: Radiation Oncology

## 2014-03-29 DIAGNOSIS — Z510 Encounter for antineoplastic radiation therapy: Secondary | ICD-10-CM | POA: Diagnosis not present

## 2014-03-29 NOTE — Progress Notes (Signed)
Cleansed around would with normal saline and sterile q tip.  No drainage noted.  Inserted 1/4 " iodoform guaze moistened with sterile normal saline into small wound under his chin.  Patient did not want the wound with packing covered.  He has supplies to do packing changes over the weekend at home.

## 2014-03-29 NOTE — Progress Notes (Signed)
IMRT Device Note    ICD-9-CM ICD-10-CM   1. 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 The code is [phone].  -----------------------------------  Eppie Gibson, MD

## 2014-04-01 ENCOUNTER — Ambulatory Visit: Payer: Medicare Other | Admitting: Nutrition

## 2014-04-01 ENCOUNTER — Ambulatory Visit: Payer: Medicare Other

## 2014-04-01 ENCOUNTER — Encounter: Payer: Self-pay | Admitting: Radiation Oncology

## 2014-04-01 ENCOUNTER — Ambulatory Visit
Admission: RE | Admit: 2014-04-01 | Discharge: 2014-04-01 | Disposition: A | Discharge status: Home / Self Care | Payer: Medicare Other | Source: Ambulatory Visit | Attending: Radiation Oncology | Admitting: Radiation Oncology

## 2014-04-01 VITALS — BP 127/74 | HR 82 | Temp 98.0°F | Resp 12 | Wt 138.7 lb

## 2014-04-01 DIAGNOSIS — C023 Malignant neoplasm of anterior two-thirds of tongue, part unspecified: Secondary | ICD-10-CM | POA: Insufficient documentation

## 2014-04-01 DIAGNOSIS — Z510 Encounter for antineoplastic radiation therapy: Secondary | ICD-10-CM | POA: Diagnosis not present

## 2014-04-01 DIAGNOSIS — R1310 Dysphagia, unspecified: Secondary | ICD-10-CM

## 2014-04-01 DIAGNOSIS — M436 Torticollis: Secondary | ICD-10-CM | POA: Diagnosis not present

## 2014-04-01 NOTE — Progress Notes (Signed)
He is currently in no pain. Pt complains of, Fatigue.  Pt denies dysphagia. PO Diet: Regular, soft. Oral exam reveals dry mouth, noted multiple "hair-like" lines on tongue surface, pt reports thick saliva. Pt reports pain in Right ear when swallowing rating it a 10, pain is aching and radiates down right side of neck. Skin is pink over post trach site.  Reports soft regular bowel movements, every other day.

## 2014-04-01 NOTE — Progress Notes (Signed)
Brief follow-up with patient after radiation and M.D. appointment. Patient continues to report some difficulty chewing and swallowing. Reports he is drinking 5-6 oral nutrition supplements daily.  He typically will make a shake using other ingredients. Weight is stable and documented as 138.7 pounds on December 21.  Nutrition diagnosis: Food and nutrition related knowledge deficit improved.  Intervention:  Patient educated to continue high-calorie high-protein foods throughout the day.   Patient should continue oral nutrition supplement shakes for weight maintenance. Provided samples and coupons. Teach back method used.  Monitoring, evaluation, goals.  Patient is tolerating adequate calories and protein for weight maintenance.  Next visit: Wednesday, December 30 after radiation treatment.  **Disclaimer: This note was dictated with voice recognition software. Similar sounding words can inadvertently be transcribed and this note may contain transcription errors which may not have been corrected upon publication of note.**

## 2014-04-01 NOTE — Progress Notes (Signed)
   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  CBCT/MVCT images/Port film x-rays were reviewed.  The chart was checked. Doing well.  Did not pack his uppper neck wound this weekend.  It appeared to close up. Has cleansed it with peroxide.  Physical Findings:  weight is 138 lb 11.2 oz (62.914 kg). His oral temperature is 98 F (36.7 C). His blood pressure is 127/74 and his pulse is 82. His respiration is 12 and oxygen saturation is 100%.   No sign of tumor recurrence in mouth or neck.  his uppper neck wound appeared to close up with no drainage.  CBC No results found for: WBC, RBC, HGB, HCT, PLT, MCV, MCH, MCHC, RDW, LYMPHSABS, MONOABS, EOSABS, BASOSABS   CMP  No results found for: NA, K, CL, CO2, GLUCOSE, BUN, CREATININE, CALCIUM, PROT, ALBUMIN, AST, ALT, ALKPHOS, BILITOT, GFRNONAA, GFRAA  No results found for: TSH  Impression:  The patient is tolerating radiotherapy.   Plan:  Continue radiotherapy as planned.    -----------------------------------  Eppie Gibson, MD

## 2014-04-01 NOTE — Therapy (Signed)
[facility] [address] [place], [place], [address] Phone: [phone]   Fax:  [phone]  Speech Language Pathology Evaluation  Patient Details  Name: Sean Hopkins MRN: [id number] Date of Birth: May 15, 1945  Encounter Date: 04/01/2014      End of Session - 04/01/14 1641    Visit Number 1   Number of Visits 6   Date for SLP Re-Evaluation 06/01/14   SLP Start Time 1405   SLP Stop Time  1449   SLP Time Calculation (min) 44 min   Activity Tolerance Patient tolerated treatment well      Past Medical History  Diagnosis Date  . 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 Hernia repair    . 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  Visit Diagnosis: Dysphagia - Plan: SLP plan of care cert/re-cert      Subjective Assessment - 04/01/14 1641    Symptoms Pt reports [facility] SLP provided exercises for speech intelligibility. Pt did not definitely know/recall frequency or duration of those exercises ("10 times a day, or something like that").    Currently in Pain? No/denies   Pain Score 0-No pain     Pt currently tolerates approx 1 cup soft food x3/day, with 5-6 nutritional liquid supplements. Reportedly pt is gaining weight. Oral motor assessment revealed minimal/absent lingual ROM and reduced lingual strength. Labial ROM was WNL and strength was WNL. Velar ROM appeared WNL. POs: Pt ate applesauce and drank H2O without overt s/s aspiration. Thyroid elevation appeared adequate, and swallows appeared somewhat timely. Oral residue noted as minimal following additional swallow with applesauce. Pt's swallow deemed at least functional at this time, without evidence of aspiration today during evaluation tasks.   Because data states the risk for dysphagia during and after radiation treatment is high  due to undergoing radiation tx, SLP taught pt about the possibility of reduced/limited ability for PO intake during rad tx. SLP encouraged pt to continue swallowing POs as far into rad tx as possible, even ingesting POs and/or completing HEP shortly after administration of pain meds.   SLP educated pt re: changes to swallowing musculature after rad tx, and why adherence to dysphagia HEP provided today and PO consumption was necessary to inhibit muscular disuse atrophy and to reduce muscle fibrosis following rad tx. Pt demonstrated understanding of these things to SLP. Further education was provided re: xerostomia, mucositis/esophagitis, and late effects head/neck radiation.   After evaluation tasks, SLP then developed a HEP for pt, and pt was instructed how to perform exercises involving lingual, vocal, and pharyngeal strengthening. SLP performed each exercise and pt return demonstrated each exercise. SLP ensured pt performance was correct prior to moving on to next exercise. Pt was instructed to complete this program 2-3 times a day, 6-7 days/week until 60 days after their last rad tx, then x3 a week after that.   Pt's speech intelligibilty was approx 90% in conversation, and inconsistently improved with SLP request for pt to repeat. Pt was encouraged to reduce his speech rate in order to improve intelligibility. Pt told SLP that [place] had encouraged him to focus on slower more deliberate speech to improve intelligibility.       SLP Education - 04/01/14 1640    Education provided Yes   Education Details HEP for swallowing, muscle fibrosis and atrophy, mucositis/esophagitis,  xerostomia   Person(s) Educated Patient   Methods Explanation;Demonstration;Handout   Comprehension Verbalized understanding;Returned demonstration          SLP Short Term Goals - Apr 19, 2014 1644    SLP SHORT TERM GOAL #1   Title pt will complete swallowing HEP with rare min A   Time 1   Period --  visit   Status  New   SLP SHORT TERM GOAL #2   Title pt will tell SLP why he is completing HEP for swallowing   Time 1   Period --  visit   Status New          SLP Long Term Goals - April 19, 2014 1644    SLP LONG TERM GOAL #1   Title pt will complete swallowing HEP with modified independence   Time 2   Period --  visits   Status New   SLP LONG TERM GOAL #2   Title pt will tell benefits of keeping a food journal in order to return to full PO diet   Time 2   Period --  visits   Status New   SLP LONG TERM GOAL #3   Title pt will tell SLP 3 s/s aspiration PNA with modified independence   Time 2   Period --  visits   Status New          Plan - 04/19/14 1642    Clinical Impression Statement Pt presents with swallowing essentially  WNL. He is compensating quite well for reconstruction of anterior tongue, with minimal protrstion/lateralization of lingual musculature.   Speech Therapy Frequency Monthly   Duration --  approx every 4 weeks x60 days   Treatment/Interventions Aspiration precaution training;Pharyngeal strengthening exercises;Oral motor exercises;Patient/family education;SLP instruction and feedback   Potential to [place] pt provided HEP today and agreed to complete as prescribed   Consulted and Agree with Plan of Care Patient          G-Codes - 04/19/14 1647    Functional Assessment Tool Used noms   Functional Limitations Swallowing   Swallow Current Status (W2993) At least 1 percent but less than 20 percent impaired, limited or restricted   Swallow Goal Status (Z1696) At least 1 percent but less than 20 percent impaired, limited or restricted      Problem List Patient Active Problem List   Diagnosis Date Noted  . Carcinoma of anterior two-thirds of tongue 2014-04-19  . Tongue cancer 03/13/2014    [facility] 04-19-14, 4:49 PM  [place] [address] [place], [place], [address] Phone: [phone]   Fax:  [phone]

## 2014-04-01 NOTE — Patient Instructions (Signed)
SWALLOWING EXERCISES Do these 6 of the 7 days per week until your last day of radiation, then 3 times per week afterwards  1. Effortful Swallows - Squeeze hard with the muscles in your neck while you swallow your  saliva or a sip of water - Repeat 15-20 times, 2-3 times a day, and use whenever you eat or drink  2. Masako Swallow - swallow with your tongue sticking out - Stick tongue out as far as you can and hold that position while you swallow - Repeat 15-20 times, 2-3 times a day *use a wet spoon if your mouth gets dry*  3. Shaker Exercise - head lift - Lie flat on your back in your bed or on a couch without pillows - Raise your head and look at your feet - KEEP YOUR SHOULDERS DOWN - HOLD FOR 45-60 SECONDS, then lower your head back down - Repeat 3 times, 2 times a day  4. Mendelsohn Maneuver - "half swallow" exercise - Start to swallow, and keep your Adam's apple up by squeezing hard with the muscles of the throat - Hold the squeeze for 5-7 seconds and then relax - Repeat 15-20 times, 2-3 times a day *use a wet spoon if your mouth gets dry*  5. Breath Hold - Say "HUH!" loudly, then hold your breath for 3 seconds at your voice box - Repeat 20 times, 2-3 times a day  6. Chin pushback - Open your mouth  - Place your fist UNDER your chin near your neck, and push back with your fist for 5 seconds - Repeat 8-10 times, 2-3 times a day  Pt was provided a paper copy of these exercises on 04-01-14

## 2014-04-02 ENCOUNTER — Ambulatory Visit
Admission: RE | Admit: 2014-04-02 | Discharge: 2014-04-02 | Disposition: A | Discharge status: Home / Self Care | Payer: Medicare Other | Source: Ambulatory Visit | Attending: Radiation Oncology | Admitting: Radiation Oncology

## 2014-04-02 DIAGNOSIS — Z510 Encounter for antineoplastic radiation therapy: Secondary | ICD-10-CM | POA: Diagnosis not present

## 2014-04-03 ENCOUNTER — Encounter: Payer: Self-pay | Admitting: *Deleted

## 2014-04-03 ENCOUNTER — Ambulatory Visit
Admission: RE | Admit: 2014-04-03 | Discharge: 2014-04-03 | Disposition: A | Discharge status: Home / Self Care | Payer: Medicare Other | Source: Ambulatory Visit | Attending: Radiation Oncology | Admitting: Radiation Oncology

## 2014-04-03 DIAGNOSIS — Z510 Encounter for antineoplastic radiation therapy: Secondary | ICD-10-CM | POA: Diagnosis not present

## 2014-04-03 NOTE — Progress Notes (Signed)
To provide support and encouragement, care continuity and to assess for needs, met with patient before his Tomo tmt.  He reported that he has been tolerating tmts.   He did not express any needs or concerns at this time, I encouraged him to contact me if that changes, he verbalized understanding.  Gayleen Orem, RN, BSN, [place] at [place] [phone]

## 2014-04-04 ENCOUNTER — Ambulatory Visit
Admission: RE | Admit: 2014-04-04 | Discharge: 2014-04-04 | Disposition: A | Discharge status: Home / Self Care | Payer: Medicare Other | Source: Ambulatory Visit | Attending: Radiation Oncology | Admitting: Radiation Oncology

## 2014-04-04 DIAGNOSIS — Z510 Encounter for antineoplastic radiation therapy: Secondary | ICD-10-CM | POA: Diagnosis not present

## 2014-04-08 ENCOUNTER — Encounter: Payer: Self-pay | Admitting: Radiation Oncology

## 2014-04-08 ENCOUNTER — Ambulatory Visit
Admission: RE | Admit: 2014-04-08 | Discharge: 2014-04-08 | Disposition: A | Discharge status: Home / Self Care | Payer: Medicare Other | Source: Ambulatory Visit | Attending: Radiation Oncology | Admitting: Radiation Oncology

## 2014-04-08 VITALS — BP 137/64 | HR 84 | Resp 16 | Wt 138.6 lb

## 2014-04-08 DIAGNOSIS — Z510 Encounter for antineoplastic radiation therapy: Secondary | ICD-10-CM | POA: Diagnosis not present

## 2014-04-08 DIAGNOSIS — C029 Malignant neoplasm of tongue, unspecified: Secondary | ICD-10-CM

## 2014-04-08 NOTE — Progress Notes (Signed)
Patient reports right ear pain 2 on a scale of 0-10 that radiates down the right side of his neck. Denies dysphagia. Denies difficulty swallowing. Reports a good appetite. Explains he is eating soft foods. Dry mouth reported. Thrush noted. Patient prescribed diflucan. Trach site closed with pink dry skin noted. Tiny wound under center of chin closed. Reports soft regular bowel movements every other day. Weight and vitals stable.

## 2014-04-08 NOTE — Progress Notes (Signed)
Weekly Management Note:  Site: Anterior tongue/bilateral neck Current Dose:  1400  cGy Projected Dose: 6000  cGy  Narrative: The patient is seen today for routine under treatment assessment. CBCT/MVCT images/port films were reviewed. The chart was reviewed.   He is doing generally well but does have continued right ear discomfort which began on initiation of radiation therapy.  He is not on any chemotherapy.  His appetite is good.  His weight is stable.  Physical Examination:  Filed Vitals:   04/08/14 1057  BP: 137/64  Pulse: 84  Resp: 16  .  Weight: 138 lb 9.6 oz (62.869 kg).  There is no palpable adenopathy in the neck.  On inspection of the oral cavity and oropharynx there is no obvious candidiasis.  Of interest is that there is hair along his right oral cavity reconstruction from his left forearm graft.  Impression: Tolerating radiation therapy well.  Plan: Continue radiation therapy as planned.

## 2014-04-09 ENCOUNTER — Ambulatory Visit
Admission: RE | Admit: 2014-04-09 | Discharge: 2014-04-09 | Disposition: A | Discharge status: Home / Self Care | Payer: Medicare Other | Source: Ambulatory Visit | Attending: Radiation Oncology | Admitting: Radiation Oncology

## 2014-04-09 DIAGNOSIS — Z510 Encounter for antineoplastic radiation therapy: Secondary | ICD-10-CM | POA: Diagnosis not present

## 2014-04-10 ENCOUNTER — Encounter: Payer: Medicare Other | Admitting: Nutrition

## 2014-04-10 ENCOUNTER — Ambulatory Visit
Admission: RE | Admit: 2014-04-10 | Discharge: 2014-04-10 | Disposition: A | Discharge status: Home / Self Care | Payer: Medicare Other | Source: Ambulatory Visit | Attending: Radiation Oncology | Admitting: Radiation Oncology

## 2014-04-10 DIAGNOSIS — Z510 Encounter for antineoplastic radiation therapy: Secondary | ICD-10-CM | POA: Diagnosis not present

## 2014-04-11 ENCOUNTER — Ambulatory Visit
Admission: RE | Admit: 2014-04-11 | Discharge: 2014-04-11 | Disposition: A | Discharge status: Home / Self Care | Payer: Medicare Other | Source: Ambulatory Visit | Attending: Radiation Oncology | Admitting: Radiation Oncology

## 2014-04-11 DIAGNOSIS — Z510 Encounter for antineoplastic radiation therapy: Secondary | ICD-10-CM | POA: Diagnosis not present

## 2014-04-15 ENCOUNTER — Encounter: Payer: Self-pay | Admitting: Radiation Oncology

## 2014-04-15 ENCOUNTER — Ambulatory Visit
Admission: RE | Admit: 2014-04-15 | Discharge: 2014-04-15 | Disposition: A | Discharge status: Home / Self Care | Payer: Medicare Other | Source: Ambulatory Visit | Attending: Radiation Oncology | Admitting: Radiation Oncology

## 2014-04-15 VITALS — BP 143/65 | HR 79 | Temp 97.8°F | Resp 20 | Wt 137.5 lb

## 2014-04-15 DIAGNOSIS — C023 Malignant neoplasm of anterior two-thirds of tongue, part unspecified: Secondary | ICD-10-CM

## 2014-04-15 DIAGNOSIS — Z510 Encounter for antineoplastic radiation therapy: Secondary | ICD-10-CM | POA: Diagnosis not present

## 2014-04-15 MED ORDER — LIDOCAINE VISCOUS 2 % MT SOLN: OROMUCOSAL | Status: AC

## 2014-04-15 NOTE — Progress Notes (Signed)
Patient denies pain, difficulty eating, swallowing. He states he has been fatigued since surgery. He sees dr at [facility] and has appointment tomorrow. He has dressing on left forearm donor site. He states he just completed antibiotic for healing of the site. Pt has not begun applying Biafine cream to neck area; advised he begin twice daily.

## 2014-04-15 NOTE — Progress Notes (Signed)
   Weekly Management Note:  Outpatient    ICD-9-CM ICD-10-CM   1. Carcinoma of anterior two-thirds of tongue 141.4 C02.3 lidocaine (XYLOCAINE) 2 % solution    Current Dose:  22 Gy  Projected Dose: 70 Gy   Narrative:  The patient presents for routine under treatment assessment.  CBCT/MVCT images/Port film x-rays were reviewed.  The chart was checked. No new complaints. Denies soreness, denies drainage from neck.  Physical Findings:  weight is 137 lb 8 oz (62.37 kg). His temperature is 97.8 F (36.6 C). His blood pressure is 143/65 and his pulse is 79. His respiration is 20.  no discharge from small hole in upper midline neck.  No mucositis or thrush  CBC No results found for: WBC, RBC, HGB, HCT, PLT, MCV, MCH, MCHC, RDW, LYMPHSABS, MONOABS, EOSABS, BASOSABS   CMP  No results found for: NA, K, CL, CO2, GLUCOSE, BUN, CREATININE, CALCIUM, PROT, ALBUMIN, AST, ALT, ALKPHOS, BILITOT, GFRNONAA, GFRAA   Impression:  The patient is tolerating radiotherapy.   Plan:  Continue radiotherapy as planned. Lidocaine Rx for mucositis in future given. Baking soda gargle recipe provided  -----------------------------------  Eppie Gibson, MD

## 2014-04-16 ENCOUNTER — Ambulatory Visit
Admission: RE | Admit: 2014-04-16 | Discharge: 2014-04-16 | Disposition: A | Discharge status: Home / Self Care | Payer: Medicare Other | Source: Ambulatory Visit | Attending: Radiation Oncology | Admitting: Radiation Oncology

## 2014-04-16 DIAGNOSIS — Z510 Encounter for antineoplastic radiation therapy: Secondary | ICD-10-CM | POA: Diagnosis not present

## 2014-04-17 ENCOUNTER — Encounter: Payer: Medicare Other | Admitting: Nutrition

## 2014-04-17 ENCOUNTER — Ambulatory Visit
Admission: RE | Admit: 2014-04-17 | Discharge: 2014-04-17 | Disposition: A | Discharge status: Home / Self Care | Payer: Medicare Other | Source: Ambulatory Visit | Attending: Radiation Oncology | Admitting: Radiation Oncology

## 2014-04-17 DIAGNOSIS — Z510 Encounter for antineoplastic radiation therapy: Secondary | ICD-10-CM | POA: Diagnosis not present

## 2014-04-18 ENCOUNTER — Ambulatory Visit
Admission: RE | Admit: 2014-04-18 | Discharge: 2014-04-18 | Disposition: A | Discharge status: Home / Self Care | Payer: Medicare Other | Source: Ambulatory Visit | Attending: Radiation Oncology | Admitting: Radiation Oncology

## 2014-04-18 DIAGNOSIS — Z510 Encounter for antineoplastic radiation therapy: Secondary | ICD-10-CM | POA: Diagnosis not present

## 2014-04-19 ENCOUNTER — Ambulatory Visit
Admission: RE | Admit: 2014-04-19 | Discharge: 2014-04-19 | Disposition: A | Discharge status: Home / Self Care | Payer: Medicare Other | Source: Ambulatory Visit | Attending: Radiation Oncology | Admitting: Radiation Oncology

## 2014-04-19 DIAGNOSIS — Z510 Encounter for antineoplastic radiation therapy: Secondary | ICD-10-CM | POA: Diagnosis not present

## 2014-04-22 ENCOUNTER — Ambulatory Visit
Admission: RE | Admit: 2014-04-22 | Discharge: 2014-04-22 | Disposition: A | Discharge status: Home / Self Care | Payer: Medicare Other | Source: Ambulatory Visit | Attending: Radiation Oncology | Admitting: Radiation Oncology

## 2014-04-22 VITALS — BP 143/68 | HR 86 | Temp 97.6°F | Resp 20 | Wt 136.2 lb

## 2014-04-22 DIAGNOSIS — Z510 Encounter for antineoplastic radiation therapy: Secondary | ICD-10-CM | POA: Diagnosis not present

## 2014-04-22 DIAGNOSIS — C023 Malignant neoplasm of anterior two-thirds of tongue, part unspecified: Secondary | ICD-10-CM

## 2014-04-22 NOTE — Progress Notes (Signed)
   Weekly Management Note:  Outpatient    ICD-9-CM ICD-10-CM   1. Carcinoma of anterior two-thirds of tongue 141.4 C02.3     Current Dose:32 Gy  Projected Dose: 70 Gy   Narrative:  The patient presents for routine under treatment assessment.  CBCT/MVCT images/Port film x-rays were reviewed.  The chart was checked Patient denies pain, loss of appetite, difficulty eating, swallowing. He states he is fatigued, but it is his baseline from before beginning treatment. His neck treatment area is pink with dryness of skin; he is not applying lotion, stating his skin is not irritated.  He is not needing lidocaine for his mouth as it's not sore, yet.  Physical Findings:  weight is 136 lb 3.2 oz (61.78 kg). His oral temperature is 97.6 F (36.4 C). His blood pressure is 143/68 and his pulse is 86. His respiration is 20.  no discharge from small hole in upper midline neck.  No mucositis or thrush. Skin in pink, dry.  CBC No results found for: WBC, RBC, HGB, HCT, PLT, MCV, MCH, MCHC, RDW, LYMPHSABS, MONOABS, EOSABS, BASOSABS   CMP  No results found for: NA, K, CL, CO2, GLUCOSE, BUN, CREATININE, CALCIUM, PROT, ALBUMIN, AST, ALT, ALKPHOS, BILITOT, GFRNONAA, GFRAA   Impression:  The patient is tolerating radiotherapy.   Plan:  Continue radiotherapy as planned. Lidocaine Rx for mucositis in future , PRN. Baking soda gargles PRN. Biafine over skin.  -----------------------------------  Eppie Gibson, MD

## 2014-04-22 NOTE — Progress Notes (Signed)
Patient denies pain, loss of appetite, difficulty eating, swallowing. He states he is fatigued, but it is his baseline from before beginning treatment. His neck treatment area is pink with dryness of skin; he is not applying lotion, stating his skin is not irritated. Advised he begin to add moisture to this area.

## 2014-04-23 ENCOUNTER — Ambulatory Visit
Admission: RE | Admit: 2014-04-23 | Discharge: 2014-04-23 | Disposition: A | Discharge status: Home / Self Care | Payer: Medicare Other | Source: Ambulatory Visit | Attending: Radiation Oncology | Admitting: Radiation Oncology

## 2014-04-23 DIAGNOSIS — Z510 Encounter for antineoplastic radiation therapy: Secondary | ICD-10-CM | POA: Diagnosis not present

## 2014-04-24 ENCOUNTER — Ambulatory Visit
Admission: RE | Admit: 2014-04-24 | Discharge: 2014-04-24 | Disposition: A | Discharge status: Home / Self Care | Payer: Medicare Other | Source: Ambulatory Visit | Attending: Radiation Oncology | Admitting: Radiation Oncology

## 2014-04-24 ENCOUNTER — Encounter: Payer: Medicare Other | Admitting: Nutrition

## 2014-04-24 DIAGNOSIS — Z510 Encounter for antineoplastic radiation therapy: Secondary | ICD-10-CM | POA: Diagnosis not present

## 2014-04-25 ENCOUNTER — Ambulatory Visit
Admission: RE | Admit: 2014-04-25 | Discharge: 2014-04-25 | Disposition: A | Discharge status: Home / Self Care | Payer: Medicare Other | Source: Ambulatory Visit | Attending: Radiation Oncology | Admitting: Radiation Oncology

## 2014-04-25 DIAGNOSIS — Z510 Encounter for antineoplastic radiation therapy: Secondary | ICD-10-CM | POA: Diagnosis not present

## 2014-04-26 ENCOUNTER — Ambulatory Visit
Admission: RE | Admit: 2014-04-26 | Discharge: 2014-04-26 | Disposition: A | Discharge status: Home / Self Care | Payer: Medicare Other | Source: Ambulatory Visit | Attending: Radiation Oncology | Admitting: Radiation Oncology

## 2014-04-26 ENCOUNTER — Encounter: Payer: Self-pay | Admitting: *Deleted

## 2014-04-26 DIAGNOSIS — Z510 Encounter for antineoplastic radiation therapy: Secondary | ICD-10-CM | POA: Diagnosis not present

## 2014-04-26 NOTE — Progress Notes (Signed)
To provide support and encouragement, care continuity and to assess for needs, met with patient prior to Tomo tmt. Neck appeared dry, skin somewhat flaky.  In response to my inquiry, stated he is applying Biafine daily before bedtime.  I encouraged BID application to maintain skin integrity.  He verbalized understanding. He denied concerns/needs, understands he can contact me.  Gayleen Orem, RN, BSN, [place] at [place] [phone]

## 2014-04-29 ENCOUNTER — Ambulatory Visit
Admission: RE | Admit: 2014-04-29 | Discharge: 2014-04-29 | Disposition: A | Discharge status: Home / Self Care | Payer: Medicare Other | Source: Ambulatory Visit | Attending: Radiation Oncology | Admitting: Radiation Oncology

## 2014-04-29 ENCOUNTER — Encounter: Payer: Self-pay | Admitting: Radiation Oncology

## 2014-04-29 DIAGNOSIS — Z510 Encounter for antineoplastic radiation therapy: Secondary | ICD-10-CM | POA: Diagnosis not present

## 2014-04-29 DIAGNOSIS — B370 Candidal stomatitis: Secondary | ICD-10-CM | POA: Insufficient documentation

## 2014-04-29 DIAGNOSIS — C023 Malignant neoplasm of anterior two-thirds of tongue, part unspecified: Secondary | ICD-10-CM

## 2014-04-29 MED ORDER — BIAFINE EX EMUL
Freq: Two times a day (BID) | CUTANEOUS | Status: DC
  Administered 2014-04-29: 11:00:00 via TOPICAL

## 2014-04-29 MED ORDER — FLUCONAZOLE 100 MG PO TABS: ORAL_TABLET | ORAL | Status: DC

## 2014-04-29 NOTE — Progress Notes (Signed)
Patient reports mouth and tip of tongue soreness, has yellow patches on tongue indicative of thrush. Pt states he has had thrush in the past. He drinks 3-5 cans of Ensure or Boost daily, eats soft foods. He has seen nutritionist, next appt 05/08/14. He states foods do not taste good. He has lost 4 lbs in past week. Advised he try drinking supplements through straw to avoid his tongue. Pt denies fatigue beyond than his baseline. He is applying Biafine to neck treatment area, dry desquamation, erythema. Gave him new tube.

## 2014-04-29 NOTE — Progress Notes (Signed)
   Weekly Management Note:  Outpatient    ICD-9-CM ICD-10-CM   1. Carcinoma of anterior two-thirds of tongue 141.4 C02.3 topical emolient (BIAFINE) emulsion     fluconazole (DIFLUCAN) 100 MG tablet    Current Dose: 42 Gy  Projected Dose: 70 Gy   Narrative:  The patient presents for routine under treatment assessment.  CBCT/MVCT images/Port film x-rays were reviewed.  The chart was checked. Mouth a little sore -- nursing noted thrush.  No other complaints.   He has lost 4 lbs over the past week.  Vitals are otherwise stable.  Physical Findings:  weight is 132 lb 3.2 oz (59.966 kg). His oral temperature is 97.2 F (36.2 C). His blood pressure is 136/64 and his pulse is 87. His respiration is 20.  no discharge from small hole in upper midline neck.    Skin in pink, dry. Thrush over tongue.  CBC No results found for: WBC, RBC, HGB, HCT, PLT, MCV, MCH, MCHC, RDW, LYMPHSABS, MONOABS, EOSABS, BASOSABS   CMP  No results found for: NA, K, CL, CO2, GLUCOSE, BUN, CREATININE, CALCIUM, PROT, ALBUMIN, AST, ALT, ALKPHOS, BILITOT, GFRNONAA, GFRAA   Impression:  The patient is tolerating radiotherapy.   Plan:  Continue radiotherapy as planned. Lidocaine Rx for mucositis in future , PRN. Baking soda gargles PRN. Biafine over skin.  Fluconazole Rx for thrush. Nutrition appt next week. -----------------------------------  Eppie Gibson, MD

## 2014-04-30 ENCOUNTER — Ambulatory Visit
Admission: RE | Admit: 2014-04-30 | Discharge: 2014-04-30 | Disposition: A | Discharge status: Home / Self Care | Payer: Medicare Other | Source: Ambulatory Visit | Attending: Radiation Oncology | Admitting: Radiation Oncology

## 2014-04-30 DIAGNOSIS — Z510 Encounter for antineoplastic radiation therapy: Secondary | ICD-10-CM | POA: Diagnosis not present

## 2014-05-01 ENCOUNTER — Ambulatory Visit
Admission: RE | Admit: 2014-05-01 | Discharge: 2014-05-01 | Disposition: A | Discharge status: Home / Self Care | Payer: Medicare Other | Source: Ambulatory Visit | Attending: Radiation Oncology | Admitting: Radiation Oncology

## 2014-05-01 DIAGNOSIS — Z510 Encounter for antineoplastic radiation therapy: Secondary | ICD-10-CM | POA: Diagnosis not present

## 2014-05-02 ENCOUNTER — Ambulatory Visit
Admission: RE | Admit: 2014-05-02 | Discharge: 2014-05-02 | Disposition: A | Discharge status: Home / Self Care | Payer: Medicare Other | Source: Ambulatory Visit | Attending: Radiation Oncology | Admitting: Radiation Oncology

## 2014-05-02 DIAGNOSIS — Z510 Encounter for antineoplastic radiation therapy: Secondary | ICD-10-CM | POA: Diagnosis not present

## 2014-05-03 ENCOUNTER — Ambulatory Visit
Admission: RE | Admit: 2014-05-03 | Discharge: 2014-05-03 | Disposition: A | Discharge status: Home / Self Care | Payer: Medicare Other | Source: Ambulatory Visit | Attending: Radiation Oncology | Admitting: Radiation Oncology

## 2014-05-03 DIAGNOSIS — Z510 Encounter for antineoplastic radiation therapy: Secondary | ICD-10-CM | POA: Diagnosis not present

## 2014-05-06 ENCOUNTER — Ambulatory Visit
Admission: RE | Admit: 2014-05-06 | Discharge: 2014-05-06 | Disposition: A | Discharge status: Home / Self Care | Payer: Medicare Other | Source: Ambulatory Visit | Attending: Radiation Oncology | Admitting: Radiation Oncology

## 2014-05-06 VITALS — BP 137/73 | HR 89 | Temp 97.9°F | Resp 18 | Wt 132.2 lb

## 2014-05-06 DIAGNOSIS — Z510 Encounter for antineoplastic radiation therapy: Secondary | ICD-10-CM | POA: Diagnosis not present

## 2014-05-06 DIAGNOSIS — C023 Malignant neoplasm of anterior two-thirds of tongue, part unspecified: Secondary | ICD-10-CM

## 2014-05-06 NOTE — Progress Notes (Signed)
   Weekly Management Note:  Outpatient    ICD-9-CM ICD-10-CM   1. Carcinoma of anterior two-thirds of tongue 141.4 C02.3     Current Dose: 52 Gy  Projected Dose: 60 Gy   Narrative:  The patient presents for routine under treatment assessment.  CBCT/MVCT images/Port film x-rays were reviewed.  The chart was checked. Patient denies pain, difficulty eating/swallowing. He drinks 3-5 nutritional supplements daily, states he has loss of appetite, no energy.  He is applying Biafine to neck treatment area for erythema, dry desquamation. He continues to take Diflucan.    Physical Findings:  weight is 132 lb 3.2 oz (59.966 kg). His temperature is 97.9 F (36.6 C). His blood pressure is 137/73 and his pulse is 89. His respiration is 18.    Skin in pink, dry. Ritta Slot has resolved over tongue. Confluent mucositis over anterior tongue graft.  CBC No results found for: WBC, RBC, HGB, HCT, PLT, MCV, MCH, MCHC, RDW, LYMPHSABS, MONOABS, EOSABS, BASOSABS   CMP  No results found for: NA, K, CL, CO2, GLUCOSE, BUN, CREATININE, CALCIUM, PROT, ALBUMIN, AST, ALT, ALKPHOS, BILITOT, GFRNONAA, GFRAA   Impression:  The patient is tolerating radiotherapy.   Plan:  Continue radiotherapy as planned.  He will complete treatments on Fri; gave him 1 month FU card (sooner f/u if needed).  He knows to call if any new issues arise.  -----------------------------------  Eppie Gibson, MD

## 2014-05-06 NOTE — Progress Notes (Signed)
Patient denies pain, difficulty eating/swallowing. He drinks 3-5 nutritional supplements daily, states he has loss of appetite, no energy. He is applying Biafine to neck treatment area for erythema, dry desquamation. He continues to take Diflucan.  He will complete treatments on Fri; gave him 1 month FU card.

## 2014-05-07 ENCOUNTER — Ambulatory Visit
Admission: RE | Admit: 2014-05-07 | Discharge: 2014-05-07 | Disposition: A | Discharge status: Home / Self Care | Payer: Medicare Other | Source: Ambulatory Visit | Attending: Radiation Oncology | Admitting: Radiation Oncology

## 2014-05-07 DIAGNOSIS — Z510 Encounter for antineoplastic radiation therapy: Secondary | ICD-10-CM | POA: Diagnosis not present

## 2014-05-08 ENCOUNTER — Ambulatory Visit
Admission: RE | Admit: 2014-05-08 | Discharge: 2014-05-08 | Disposition: A | Discharge status: Home / Self Care | Payer: Medicare Other | Source: Ambulatory Visit | Attending: Radiation Oncology | Admitting: Radiation Oncology

## 2014-05-08 ENCOUNTER — Encounter: Payer: Medicare Other | Admitting: Nutrition

## 2014-05-08 ENCOUNTER — Encounter: Payer: Self-pay | Admitting: Nutrition

## 2014-05-08 DIAGNOSIS — Z510 Encounter for antineoplastic radiation therapy: Secondary | ICD-10-CM | POA: Diagnosis not present

## 2014-05-08 NOTE — Progress Notes (Signed)
Patient did not show up for scheduled nutrition appointment.  He finishes treatment this week.

## 2014-05-09 ENCOUNTER — Ambulatory Visit
Admission: RE | Admit: 2014-05-09 | Discharge: 2014-05-09 | Disposition: A | Discharge status: Home / Self Care | Payer: Medicare Other | Source: Ambulatory Visit | Attending: Radiation Oncology | Admitting: Radiation Oncology

## 2014-05-09 DIAGNOSIS — Z510 Encounter for antineoplastic radiation therapy: Secondary | ICD-10-CM | POA: Diagnosis not present

## 2014-05-10 ENCOUNTER — Ambulatory Visit
Admission: RE | Admit: 2014-05-10 | Discharge: 2014-05-10 | Disposition: A | Discharge status: Home / Self Care | Payer: Medicare Other | Source: Ambulatory Visit | Attending: Radiation Oncology | Admitting: Radiation Oncology

## 2014-05-10 ENCOUNTER — Encounter: Payer: Self-pay | Admitting: Radiation Oncology

## 2014-05-10 DIAGNOSIS — Z510 Encounter for antineoplastic radiation therapy: Secondary | ICD-10-CM | POA: Diagnosis not present

## 2014-05-17 ENCOUNTER — Telehealth: Payer: Self-pay | Admitting: *Deleted

## 2014-05-17 NOTE — Telephone Encounter (Signed)
Called patient to check on his well-being since completing RT on 05/10/14. 1. He reported:  Continuing fatigue.  Absence of pain.  Eating and drinking though has to force himself b/c "tastes terrible".  We discussed that these are normal SEs of RT, that they will resolve gradually as he is further out from end of RT. 2. Continues to apply Biafine twice daily.  I encouraged him to apply more frequently as needed. 3. He confirmed understanding of 2/24 11:40 appt with Dr. Isidore Moos. 4. He understands he can contact me with needs/concerns.  Gayleen Orem, RN, BSN, [facility] [phone]

## 2014-05-20 NOTE — Progress Notes (Signed)
  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 Site/dose:   Tongie and bilateral neck / 60 Gy in 30 fractions to higher risk regions, 54 Gy in 30 fractions to intermediate risk regions  Beams/energy:   Helical IMRT / 6 MV photons  Narrative: The patient tolerated radiation treatment relatively well.      Plan: The patient has completed radiation treatment. The patient will return to radiation oncology clinic for routine followup in one month. I advised them to call or return sooner if they have any questions or concerns related to their recovery or treatment.  -----------------------------------  Eppie Gibson, MD

## 2014-05-28 ENCOUNTER — Encounter: Payer: Self-pay | Admitting: *Deleted

## 2014-06-04 ENCOUNTER — Encounter: Payer: Self-pay | Admitting: *Deleted

## 2014-06-05 ENCOUNTER — Ambulatory Visit
Admission: RE | Admit: 2014-06-05 | Discharge: 2014-06-05 | Disposition: A | Discharge status: Home / Self Care | Payer: Medicare Other | Source: Ambulatory Visit | Attending: Radiation Oncology | Admitting: Radiation Oncology

## 2014-06-05 ENCOUNTER — Encounter: Payer: Self-pay | Admitting: Radiation Oncology

## 2014-06-05 VITALS — BP 146/77 | HR 62 | Temp 98.2°F | Resp 20 | Ht 66.0 in | Wt 127.1 lb

## 2014-06-05 DIAGNOSIS — C023 Malignant neoplasm of anterior two-thirds of tongue, part unspecified: Secondary | ICD-10-CM

## 2014-06-05 DIAGNOSIS — R634 Abnormal weight loss: Secondary | ICD-10-CM

## 2014-06-05 NOTE — Progress Notes (Signed)
Radiation Oncology         [phone] ________________________________  Name: Sean Hopkins MRN: [id number]  Date: 06/05/2014  DOB: 1945/08/28  Follow-Up Visit Note  CC: No primary care provider on file.  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  He has minimal complaints.  He recently saw Dr. Nicolette Bang (NED per his note in [place]) and plans to see him again in 2 months.   Eating is not enjoyable due to dysguesia but he is forcing himself to do so and drinks plenty of fluid.                      ALLERGIES:  has No Known Allergies.  Meds: Current Outpatient Prescriptions  Medication Sig Dispense Refill  . aspirin 325 MG tablet Take 325 mg by mouth.    . chlorhexidine (PERIDEX) 0.12 % solution Take 15 mLs by mouth.    Marland Kitchen HYDROcodone-acetaminophen (NORCO) 5-325 MG per tablet Take 1 tablet by mouth every 6 (six) hours as needed for moderate pain.     . fluconazole (DIFLUCAN) 100 MG tablet Take 2 tablets today, then 1 tablet daily x 20 more days. (Patient not taking: Reported on 06/05/2014) 22 tablet 0  . ibuprofen (ADVIL,MOTRIN) 200 MG tablet Take 800 mg by mouth every 6 (six) hours as needed for moderate pain.    Marland Kitchen lidocaine (XYLOCAINE) 2 % solution Patient: Mix 1part 2% viscous lidocaine, 1part H20. Swish and/or swallow 86mL of this mixture, 20min before meals and at bedtime, up to QID (Patient not taking: Reported on 06/05/2014) 100 mL 5  . Skin Protectants, Misc. (EUCERIN) cream Apply to affected area twice daily    . terazosin (HYTRIN) 1 MG capsule Take 1 mg by mouth.     No current facility-administered medications for this encounter.    Physical Findings: The patient is in no acute distress. Patient is alert and oriented. Wt Readings from Last 3 Encounters:  05/06/14 132 lb 3.2 oz (59.966 kg)  04/22/14 136 lb 3.2 oz  (61.78 kg)  03/13/14 139 lb 6.4 oz (63.231 kg)    height is 5\' 6"  (1.676 m) and weight is 127 lb 1.6 oz (57.652 kg). His oral temperature is 98.2 F (36.8 C). His blood pressure is 146/77 and his pulse is 62. His respiration is 20 and oxygen saturation is 100%. . Oropharyngeal mucosa is intact with no thrush or lesions. No palpable cervical or supraclavicular lymphadenopathy. Skin intact and smooth over neck.    Lab Findings: No results found for: WBC, HGB, HCT, MCV, PLT  No results found for: TSH  Radiographic Findings: No results found.  Impression/Plan:    1) Head and Neck Cancer Status: healing well with NED  2) Nutritional Status: - weight: slightly decreased.  Patient offered re-referral to nutritionist but declines.  Will push PO intake - PEG tube: none  3) Risk Factors: The patient has been educated about risk factors including alcohol and tobacco abuse; they understand that avoidance of alcohol and tobacco is important to prevent recurrences as well as other cancers  4) Swallowing: functional, no active issues  5) Dental: edentulous  6) Thyroid function: check TSH in 34mo  7) Social: No active social issues to address at this time   8) Other:  n/a  9) Follow-up in 4 months; Dr Nicolette Bang sees him  in 2 mo and eventually plans to order his imaging so I will defer to him on that.  The patient was encouraged to call with any issues or questions before then.  _____________________________________   Eppie Gibson, MD

## 2014-06-05 NOTE — Progress Notes (Signed)
Follow up s/p rad txs  03/28/14-05/10/14, neck well healed,still dry areas, not using any cream, no c/o difficulty swallowing, but has no taste, says"I eat to Live", no pain or nausea, , hs dry mouth, uses salt water rinses and drinks plenty water throughout the day, energy level none,"don't have any" does do the mouth exercises stated 11:43 AM

## 2014-06-06 ENCOUNTER — Telehealth: Payer: Self-pay | Admitting: *Deleted

## 2014-06-06 NOTE — Telephone Encounter (Signed)
CALLED PATIENT TO INFORM OF LAB FOR 10-11-14 @ 10 AM, SPOKE WITH PATIENT AND HE IS AWARE OF THIS LAB

## 2014-08-03 NOTE — Op Note (Signed)
PATIENT NAME:  Sean Hopkins, Sean Hopkins MR#:  [id number] DATE OF BIRTH:  10/27/1945  DATE OF PROCEDURE:  01/23/2014  PREOPERATIVE DIAGNOSIS:  Hemorrhage from left oral tongue wound.  POSTOPERATIVE DIAGNOSIS:   Hemorrhage from left oral tongue wound.    PROCEDURE:  Cautery for control of bleeding from left oral tongue wound.   SURGEON: Malon Kindle, MD   ANESTHESIA: Local.   DESCRIPTION OF PROCEDURE: After discussing the procedure with the patient, the region of this left oral tongue wound was injected with 1% lidocaine with epinephrine 1:200,000.  Bipolar cautery was then used to cauterize bleeding areas within the depths of the wound. There were 2 prominent bleeding sites; 1 maybe a centimeter inside the depths of the wound and another area much deeper in the deepest region of the wound.  Both of these areas were able to be adequately controlled with the bipolar cautery, however.  There was a very minimal degree of capillary oozing at the completion of the procedure.  I watched the patient for several minutes to make sure no further heavy bleeding occurred.  Just to be sure, a small amount of Surgicel was then placed into the area packing the wound.  The patient tolerated the procedure well.   ____________________________ Sammuel Hines. Richardson Landry, MD psb:DT D: 01/23/2014 22:54:08 ET T: 01/24/2014 10:24:50 ET JOB#: [id number]  cc: Sammuel Hines. Richardson Landry, MD, <Dictator> Riley Nearing MD ELECTRONICALLY SIGNED 01/29/2014 13:19

## 2014-08-03 NOTE — Consult Note (Signed)
PATIENT NAME:  Sean Hopkins, Sean Hopkins MR#:  [id number] DATE OF BIRTH:  09/02/1945  DATE OF CONSULTATION:  01/23/2014  REFERRING PHYSICIAN:  Lennette Bihari A. Paduchowski, MD CONSULTING PHYSICIAN:  Sammuel Hines. Richardson Landry, MD  REASON FOR CONSULTATION:  Bleeding from tongue.   HISTORY OF PRESENT ILLNESS:  This 69 year old male underwent an extensive biopsy of the left oral tongue at [facility] earlier this morning. He was sent home with packing in place. The packing came out later this afternoon, and he developed persistent bleeding from the site. The Emergency Room physician attempted cautery with silver nitrate and packing with Surgicel, but was unsuccessful in getting the bleeding to cease. The biopsy was done for suspected oral tongue cancer, and he has followup with those physicians. He was unable to contact them this afternoon when the bleeding occurred, so they came to the Emergency Room.   PAST MEDICAL HISTORY:  Otherwise unremarkable with no known history of cardiac disease or hypertension or bleeding disorders.   MEDICATIONS:  None other than ibuprofen p.r.n.   ALLERGIES:  None.   SOCIAL HISTORY:  The patient is a current smoker, smoking several packs per day for several years.   REVIEW OF SYSTEMS:  He has not had any history of free bleeding. No recent fever, sore throat, difficulty swallowing, or blood in his stools.   PHYSICAL EXAMINATION: VITAL SIGNS:  Temperature 98.5, pulse 95, blood pressure 162/93.  GENERAL:  A thin male in no acute distress, but obviously bleeding from the oral cavity. There is a large amount of blood and blood clots in the sink in the room where he has been bleeding.  HEAD AND FACE:  Head is normocephalic and atraumatic. There are no facial skin lesions.  EARS:  External ears, ear canals, and tympanic membranes are clear bilaterally with no middle ear effusion or infection.  NOSE:  External nose unremarkable. Nasal cavity is clear. No purulence or polyps are seen. Septum is  relatively straight.  ORAL CAVITY AND OROPHARYNX:  The patient is partially edentulous. He had some packing in place and active bleeding from a large wound to the left anterior oral tongue. The biopsy site is approximately 2 cm in size and quite deep and appears to be bleeding from an arterial source in the depths of the wound. No other lesions are seen. The posterior oral cavity is clear.  NECK:  Supple without adenopathy or mass. There is no thyromegaly. The salivary glands are soft and nontender and without masses.  NEUROLOGIC:  Cranial nerves II through XII are grossly intact. Facial strength is normal.   ASSESSMENT:  A patient with bleeding from a left tongue wound where he had a previous biopsy earlier today.   PLAN:  I was able to get the bleeding controlled after cauterizing with a bipolar cautery unit. There was no significant bleeding at the completion of the procedure, although I did also place some Surgicel in the depths of the wound just to help reduce the chance of any further bleeding. He will be discharged home and already has pain medicine provided by his other physicians and should follow up with them if any further problems occur as well as for followup of his biopsy results.    ____________________________ Sammuel Hines. Richardson Landry, MD psb:nb D: 01/23/2014 22:52:09 ET T: 01/24/2014 00:47:25 ET JOB#: [id number]  cc: Sammuel Hines. Richardson Landry, MD, <Dictator> Riley Nearing MD ELECTRONICALLY SIGNED 01/29/2014 13:19

## 2014-10-09 NOTE — Progress Notes (Signed)
Pain Status: 8/10 in his tongue.  He is taking norco 1-2 tablets daily and ibuprofen 2-3 tablets daily  Orthostatic Standing Vital Signs: BP: 182/80 siting, 170/80 standing P: 68 sitting, 70 standing   Nutritional Status a) intake: drinking tea, water, eating softer foods, can't chew b) using a feeding tube?: no c) weight changes, if any: down 6 lbs since 2/24 Wt Readings from Last 3 Encounters:  06/05/14 127 lb 1.6 oz (57.652 kg)  05/06/14 132 lb 3.2 oz (59.966 kg)  04/29/14 132 lb 3.2 oz (59.966 kg)    Swallowing Status: Pt denies dysphagia.  Smoking or chewing tobacco? no  Dental (if applicable): edentulous    When was last ENT visit? June 2016  When is next ENT visit? August, 2016  Summary of last medical oncology visit (if applicable) is.   Next med/onc visit is on.  Imaging done in the last month (if applicable) revealed: none  Other notable issues, if any: Reports feeling fatigued. Reports food tastes like "crap."  He does not have an appetite.  Yellowish coating noted on the sides of his tongue.   BP 182/80 mmHg  Pulse 68  Temp(Src) 97.9 F (36.6 C) (Oral)  Resp 20  Ht 5\' 6"  (1.676 m)  Wt 121 lb 6.4 oz (55.067 kg)  BMI 19.60 kg/m2  SpO2 100%

## 2014-10-11 ENCOUNTER — Encounter: Payer: Self-pay | Admitting: *Deleted

## 2014-10-11 ENCOUNTER — Ambulatory Visit (HOSPITAL_BASED_OUTPATIENT_CLINIC_OR_DEPARTMENT_OTHER)
Admission: RE | Admit: 2014-10-11 | Discharge: 2014-10-11 | Disposition: A | Discharge status: Home / Self Care | Payer: Medicare Other | Source: Ambulatory Visit | Attending: Radiation Oncology | Admitting: Radiation Oncology

## 2014-10-11 ENCOUNTER — Encounter: Payer: Self-pay | Admitting: Radiation Oncology

## 2014-10-11 ENCOUNTER — Ambulatory Visit
Admission: RE | Admit: 2014-10-11 | Discharge: 2014-10-11 | Disposition: A | Discharge status: Home / Self Care | Payer: Medicare Other | Source: Ambulatory Visit | Attending: Radiation Oncology | Admitting: Radiation Oncology

## 2014-10-11 VITALS — BP 182/80 | HR 68 | Temp 97.9°F | Resp 20 | Ht 66.0 in | Wt 121.4 lb

## 2014-10-11 DIAGNOSIS — C023 Malignant neoplasm of anterior two-thirds of tongue, part unspecified: Secondary | ICD-10-CM

## 2014-10-11 DIAGNOSIS — R5383 Other fatigue: Secondary | ICD-10-CM

## 2014-10-11 LAB — TSH CHCC: TSH: 0.706 m(IU)/L (ref 0.320–4.118)

## 2014-10-11 MED ORDER — FLUCONAZOLE 100 MG PO TABS: ORAL_TABLET | ORAL | Status: AC

## 2014-10-11 NOTE — Progress Notes (Addendum)
Radiation Oncology         [phone] ________________________________  Name: Sean Hopkins MRN: [id number]  Date: 10/11/2014  DOB: 09/18/1945  Follow-Up Visit Note  CC: No primary care provider on file.  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 Carcinoma of anterior two-thirds of tongue 141.4 C02.3 HYDROcodone-acetaminophen (NORCO) 5-325 MG per tablet     fluconazole (DIFLUCAN) 100 MG tablet     Ambulatory referral to Nutrition and Diabetic Education     CT Chest W Contrast     CT Soft Tissue Neck W Contrast     Creatinine, serum     BUN    Indication for treatment:  Post-operative, curative     Radiation treatment dates:   12/17/20116-05/10/2014 . Site/dose:   Tongue and bilateral neck / 60 Gy in 30 fractions to higher risk regions, 54 Gy in 30 fractions to intermediate risk regions  Narrative:  The patient returns today for routine follow-up. According to [facility] records, no images have been performed since November 2015. Main issues are tongue pain and no sense of taste. Dr. Nicolette Bang appreciated a 3 mm ulcer on the r posterior tongue and felt he had no evidence of cancer on exam.  Pain Status: 8/10 in his tongue. He describes the pain as all over his tongue. He describes it as a new pain. He is taking norco 1-2 tablets daily and ibuprofen 2-3 tablets daily  Orthostatic Standing Vital Signs: BP: 182/80 siting, 170/80 standing P: 68 sitting, 70 standing  Nutritional Status  a) intake: drinking tea, water, eating softer foods, can't chew. The pt has no teeth. b) using a feeding tube?: no  c) weight changes, if any: down 6 lbs since 2/24  Wt Readings from Last 3 Encounters:   06/05/14  127 lb 1.6 oz (57.652 kg)   05/06/14  132 lb 3.2 oz (59.966 kg)   04/29/14  132 lb 3.2 oz (59.966 kg)    Swallowing Status: Pt denies dysphagia.  Smoking or chewing  tobacco? no  Dental (if applicable): edentulous  When was last ENT visit? June 2016 When is next ENT visit? August, 2016  Summary of last medical oncology visit (if applicable) is. Next med/onc visit is on.  Imaging done in the last month (if applicable) revealed: None. According to [facility] records, no images have been performed since November 2015. Other notable issues, if any: Reports feeling fatigued. Reports food tastes poor. He does not have an appetite.    ALLERGIES:  has No Known Allergies.  Meds: Current Outpatient Prescriptions  Medication Sig Dispense Refill  . aspirin 325 MG tablet Take 325 mg by mouth.    Marland Kitchen HYDROcodone-acetaminophen (NORCO) 5-325 MG per tablet Take 1 tablet by mouth.    Marland Kitchen ibuprofen (ADVIL,MOTRIN) 200 MG tablet Take 800 mg by mouth every 6 (six) hours as needed for moderate pain.    . fluconazole (DIFLUCAN) 100 MG tablet Take 2 tablets today, then 1 tablet daily x 20 more days. 22 tablet 0  . lidocaine (XYLOCAINE) 2 % solution Patient: Mix 1part 2% viscous lidocaine, 1part H20. Swish and/or swallow 70mL of this mixture, 70min before meals and at bedtime, up to QID (Patient not taking: Reported on 06/05/2014) 100 mL 5  . Skin Protectants, Misc. (EUCERIN) cream Apply to affected area twice daily    . terazosin (HYTRIN) 1 MG  capsule Take 1 mg by mouth.     No current facility-administered medications for this encounter.    Physical Findings: The patient is in no acute distress. Patient is alert and oriented. Wt Readings from Last 3 Encounters:  10/11/14 121 lb 6.4 oz (55.067 kg)  06/05/14 127 lb 1.6 oz (57.652 kg)  05/06/14 132 lb 3.2 oz (59.966 kg)    height is 5\' 6"  (1.676 m) and weight is 121 lb 6.4 oz (55.067 kg). His oral temperature is 97.9 F (36.6 C). His blood pressure is 182/80 and his pulse is 68. His respiration is 20 and oxygen saturation is 100%.  General: Alert and oriented, in no acute distress HEENT: Head is normocephalic. Extraocular  movements are intact. Pt has thrush in the oral cavity. Pt has xerostomia. Neck: Neck is supple, no palpable cervical or supraclavicular lymphadenopathy. Heart: Regular in rate and rhythm with no murmurs, rubs, or gallops. Chest: Clear to auscultation bilaterally, with no rhonchi, wheezes, or rales. Abdomen: Soft, nontender, nondistended, with no rigidity or guarding. Extremities: No cyanosis or edema. Skin: healed over neck Lymphatics: see Neck Exam Neurologic: Cranial nerves II through XII are grossly intact. No obvious focalities. Psychiatric: Judgment and insight are intact. Affect is blunted   Lab Findings: No results found for: WBC, HGB, HCT, MCV, PLT  Lab Results  Component Value Date   TSH 0.706 10/11/2014    Radiographic Findings: No results found.  Impression/Plan:    1) Head and Neck Cancer Status:  NED but has weight loss  2) Nutritional Status: drinking tea, water, eating softer foods, can't chew. The pt is edentulous. - weight: Down 6 lbs since February. Refer back to [employer] of nutrition - PEG tube: none  3) Risk Factors: The patient has been educated about risk factors including alcohol and tobacco abuse; they understand that avoidance of alcohol and tobacco is important to prevent recurrences as well as other cancers  4) Swallowing: functional, no active issues  5) Dental: edentulous.  6) Thyroid function: check TSH in 29mo Lab Results  Component Value Date   TSH 0.706 10/11/2014   TSH results pending today.  7) Social: No active social issues to address at this time   8) Other: I will have the pt referred to a nutritionist to slow his weight loss.  Fluconazole for thrush.  9) I gave the pt a card to follow up with me in November. We will order CT imaging for later this month. I will prescribe an antifungal for the pt's thrush. His pharmacy is [facility]. The pt is scheduled to see Dr.Waltonen in August. The patient was encouraged to call with  any issues or questions before then.  This document serves as a record of services personally performed by Eppie Gibson, MD. It was created on her behalf by Darcus Austin, a trained medical scribe. The creation of this record is based on the scribe's personal observations and the provider's statements to them. This document has been checked and approved by the attending provider.     _____________________________________   Eppie Gibson, MD

## 2014-10-11 NOTE — Progress Notes (Signed)
Oncology Nurse Navigator Documentation  Oncology Nurse Navigator Flowsheets 10/11/2014  Navigator Encounter Type Clinic/MDC  Patient Visit Type Radonc  Met briefly with patient prior to 5-m follow-up appt with Dr. Isidore Moos.  He did not express any needs or concerns at this time, I encouraged him to contact me if that changes, he verbalized understanding.  Time Spent with Patient [name], RN, BSN, [place] [facility] [place] [phone]

## 2014-10-15 ENCOUNTER — Telehealth: Payer: Self-pay | Admitting: *Deleted

## 2014-10-15 NOTE — Telephone Encounter (Signed)
CALLED PATIENT TO INFORM OF LAB, TEST AND FU, LVM FOR A RETURN CALL 

## 2014-10-21 ENCOUNTER — Ambulatory Visit (HOSPITAL_COMMUNITY)
Admission: RE | Admit: 2014-10-21 | Discharge: 2014-10-21 | Disposition: A | Discharge status: Home / Self Care | Payer: Medicare Other | Source: Ambulatory Visit | Attending: Radiation Oncology | Admitting: Radiation Oncology

## 2014-10-21 ENCOUNTER — Ambulatory Visit (HOSPITAL_BASED_OUTPATIENT_CLINIC_OR_DEPARTMENT_OTHER)
Admission: RE | Admit: 2014-10-21 | Discharge: 2014-10-21 | Disposition: A | Discharge status: Home / Self Care | Payer: Medicare Other | Source: Ambulatory Visit | Attending: Radiation Oncology | Admitting: Radiation Oncology

## 2014-10-21 DIAGNOSIS — C023 Malignant neoplasm of anterior two-thirds of tongue, part unspecified: Secondary | ICD-10-CM

## 2014-10-21 DIAGNOSIS — C029 Malignant neoplasm of tongue, unspecified: Secondary | ICD-10-CM | POA: Insufficient documentation

## 2014-10-21 DIAGNOSIS — R634 Abnormal weight loss: Secondary | ICD-10-CM

## 2014-10-21 DIAGNOSIS — C099 Malignant neoplasm of tonsil, unspecified: Secondary | ICD-10-CM | POA: Diagnosis present

## 2014-10-21 LAB — BUN AND CREATININE (CC13)
BUN: 25.5 mg/dL (ref 7.0–26.0)
CREATININE: 1.3 mg/dL (ref 0.7–1.3)
EGFR: 54 mL/min/{1.73_m2} — AB (ref >90)

## 2014-10-21 LAB — TSH CHCC: TSH: 0.729 m[IU]/L (ref 0.320–4.118)

## 2014-10-21 MED ORDER — IOHEXOL 300 MG/ML  SOLN
100.0000 mL | Freq: Once | INTRAMUSCULAR | Status: AC | PRN
Start: 2014-10-21 — End: 2014-10-21
  Administered 2014-10-21: 100 mL via INTRAVENOUS

## 2014-10-22 ENCOUNTER — Encounter: Payer: Self-pay | Admitting: Nutrition

## 2014-10-22 ENCOUNTER — Encounter: Payer: Medicare Other | Admitting: Nutrition

## 2014-10-22 NOTE — Progress Notes (Signed)
Patient did not show up for scheduled nutrition appointment. 

## 2014-10-23 ENCOUNTER — Ambulatory Visit: Payer: Self-pay | Admitting: Radiation Oncology

## 2014-10-23 ENCOUNTER — Other Ambulatory Visit: Payer: Self-pay | Admitting: Radiation Oncology

## 2014-10-23 ENCOUNTER — Encounter: Payer: Self-pay | Admitting: Radiation Oncology

## 2014-10-23 NOTE — Progress Notes (Signed)
I have tried multiple times yesterday and today to reach Mr Mcevers to discuss his CT results. I have left messages on his voicemail and with a family member asking for him to return my call.   I intend to order a biopsy of his tongue lesion, but will not do so until he calls back to discuss his CT results that are concerning for tongue recurrence.  -----------------------------------  Eppie Gibson, MD

## 2014-10-25 ENCOUNTER — Other Ambulatory Visit: Payer: Self-pay | Admitting: Radiation Oncology

## 2014-10-25 DIAGNOSIS — C023 Malignant neoplasm of anterior two-thirds of tongue, part unspecified: Secondary | ICD-10-CM

## 2014-10-29 ENCOUNTER — Other Ambulatory Visit: Payer: Self-pay | Admitting: Physician Assistant

## 2014-10-30 ENCOUNTER — Telehealth: Payer: Self-pay | Admitting: *Deleted

## 2014-10-30 ENCOUNTER — Ambulatory Visit (HOSPITAL_COMMUNITY)
Admission: RE | Admit: 2014-10-30 | Discharge: 2014-10-30 | Disposition: A | Discharge status: Home / Self Care | Payer: Medicare Other | Source: Ambulatory Visit | Attending: Radiation Oncology | Admitting: Radiation Oncology

## 2014-10-30 ENCOUNTER — Encounter (HOSPITAL_COMMUNITY): Payer: Self-pay

## 2014-10-30 DIAGNOSIS — C023 Malignant neoplasm of anterior two-thirds of tongue, part unspecified: Secondary | ICD-10-CM

## 2014-10-30 DIAGNOSIS — Z923 Personal history of irradiation: Secondary | ICD-10-CM | POA: Insufficient documentation

## 2014-10-30 DIAGNOSIS — Z79899 Other long term (current) drug therapy: Secondary | ICD-10-CM | POA: Diagnosis not present

## 2014-10-30 DIAGNOSIS — C01 Malignant neoplasm of base of tongue: Secondary | ICD-10-CM | POA: Insufficient documentation

## 2014-10-30 DIAGNOSIS — Z87891 Personal history of nicotine dependence: Secondary | ICD-10-CM | POA: Insufficient documentation

## 2014-10-30 DIAGNOSIS — K149 Disease of tongue, unspecified: Secondary | ICD-10-CM | POA: Diagnosis present

## 2014-10-30 LAB — CBC
HCT: 38.6 % — ABNORMAL LOW (ref 39.0–52.0)
Hemoglobin: 12.6 g/dL — ABNORMAL LOW (ref 13.0–17.0)
MCH: 28.6 pg (ref 26.0–34.0)
MCHC: 32.6 g/dL (ref 30.0–36.0)
MCV: 87.5 fL (ref 78.0–100.0)
PLATELETS: 243 10*3/uL (ref 150–400)
RBC: 4.41 MIL/uL (ref 4.22–5.81)
RDW: 13.4 % (ref 11.5–15.5)
WBC: 6.9 10*3/uL (ref 4.0–10.5)

## 2014-10-30 LAB — APTT: APTT: 49 s — AB (ref 24–37)

## 2014-10-30 LAB — PROTIME-INR
INR: 1.14 (ref 0.00–1.49)
PROTHROMBIN TIME: 14.8 s (ref 11.6–15.2)

## 2014-10-30 MED ORDER — FENTANYL CITRATE (PF) 100 MCG/2ML IJ SOLN
INTRAMUSCULAR | Status: AC | PRN
  Administered 2014-10-30: 50 ug via INTRAVENOUS

## 2014-10-30 MED ORDER — LIDOCAINE HCL (PF) 1 % IJ SOLN
INTRAMUSCULAR | Status: AC
  Filled 2014-10-30: qty 10

## 2014-10-30 MED ORDER — FENTANYL CITRATE (PF) 100 MCG/2ML IJ SOLN
INTRAMUSCULAR | Status: AC
  Filled 2014-10-30: qty 4

## 2014-10-30 MED ORDER — MIDAZOLAM HCL 2 MG/2ML IJ SOLN
INTRAMUSCULAR | Status: AC
  Filled 2014-10-30: qty 4

## 2014-10-30 MED ORDER — MIDAZOLAM HCL 2 MG/2ML IJ SOLN
INTRAMUSCULAR | Status: AC | PRN
  Administered 2014-10-30: 1 mg via INTRAVENOUS

## 2014-10-30 MED ORDER — HYDROCODONE-ACETAMINOPHEN 5-325 MG PO TABS: 1.0000 | ORAL_TABLET | ORAL | Status: DC | PRN

## 2014-10-30 MED ORDER — SODIUM CHLORIDE 0.9 % IV SOLN
INTRAVENOUS | Status: DC
  Administered 2014-10-30: 13:00:00 via INTRAVENOUS

## 2014-10-30 NOTE — Sedation Documentation (Signed)
O2 2l/[place] started 

## 2014-10-30 NOTE — H&P (Signed)
Chief Complaint: Patient was seen in consultation today for recurrent tongue mass at the request of [facility]  Referring Physician(s): Squire,Sarah  History of Present Illness: Sean Hopkins is a 69 y.o. male   Dx oral/tongue cancer: squamous cell cancer 2015 Radiation x 1 month Now with recurrent mass CT 10/21/14: IMPRESSION: 1. Progression of disease with large 4.8 cm enhancing mass affecting the intrinsic muscles of the left tongue and extending to the left sublingual space. The mass abuts the inner cortex of the left mandible, but without CT evidence of direct bone invasion. 2. Mandible symphysis ORIF without solid osseous union. This might be postoperative in nature rather than due to a pathologic fracture. 3. Sequelae of neck XRT and submandibular gland resection. No lymphadenopathy identified. 4. Chest CT from today reported separately.  Request made for biopsy of tongue mass Now scheduled for same   Past Medical History  Diagnosis Date  . 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 History of radiation therapy 12/17/20116-05/10/2014    tongue/bilateral neck/60 Gy/30 fx to higher risk regions/ 54 Gy /30 fx to intermediate risk regions    Past Surgical History  Procedure Laterality Date  . Hernia repair    . Tonsillar biopsy Right 02/15/14    Squamous Cell Carcinoma with 1/4 Lymph Nodes Positive    Allergies: Review of patient's allergies indicates no known allergies.  Medications: Prior to Admission medications   Medication Sig Start Date End Date Taking? Authorizing Provider  fluconazole (DIFLUCAN) 100 MG tablet Take 2 tablets today, then 1 tablet daily x 20 more days. 10/11/14  Yes Eppie Gibson, MD  HYDROcodone-acetaminophen (NORCO) 5-325 MG per tablet Take 1 tablet by mouth every 6 (six) hours as needed for moderate pain.  10/07/14  Yes Historical Provider, MD  ibuprofen (ADVIL,MOTRIN) 200 MG tablet Take 800 mg by mouth every 6 (six) hours as needed for moderate pain.   Yes  Historical Provider, MD  terazosin (HYTRIN) 1 MG capsule Take 1 mg by mouth. 02/22/14  Yes Historical Provider, MD  lidocaine (XYLOCAINE) 2 % solution Patient: Mix 1part 2% viscous lidocaine, 1part H20. Swish and/or swallow 72mL of this mixture, 20min before meals and at bedtime, up to QID Patient not taking: Reported on 06/05/2014 04/15/14   Eppie Gibson, MD     History reviewed. No pertinent family history.  History   Social History  . Marital Status: Married    Spouse Name: N/A  . Number of Children: N/A  . Years of Education: N/A   Social History Main Topics  . Smoking status: Former Smoker -- 20 years  . Smokeless tobacco: Not on file     Comment: Stopped smoking at age 78  . Alcohol Use: 0.0 oz/week    0 Standard drinks or equivalent per week     Comment: Social  . Drug Use: No  . Sexual Activity: Not on file   Other Topics Concern  . None   Social History Narrative    Review of Systems: A 12 point ROS discussed and pertinent positives are indicated in the HPI above.  All other systems are negative.  Review of Systems  Constitutional: Positive for appetite change and unexpected weight change. Negative for activity change.  HENT: Positive for sore throat.   Respiratory: Negative for shortness of breath.   Gastrointestinal: Negative for abdominal pain.  Psychiatric/Behavioral: Negative for behavioral problems and confusion.    Vital Signs: BP 152/87 mmHg  Pulse 70  Temp(Src) 97.7 F (36.5 C)  Resp 18  Ht 5\' 6"  (1.676 m)  Wt 121 lb (54.885 kg)  BMI 19.54 kg/m2  SpO2 100%  Physical Exam  Constitutional: He is oriented to person, place, and time.  HENT:  Unable to open mouth very wide Speech is impeded with tongue mass  Cardiovascular: Normal rate, regular rhythm and normal heart sounds.   Pulmonary/Chest: Effort normal and breath sounds normal. He has no wheezes.  Abdominal: Soft. Bowel sounds are normal. There is no tenderness.  Musculoskeletal: Normal  range of motion.  Neurological: He is alert and oriented to person, place, and time.  Skin: Skin is warm and dry.  Psychiatric: He has a normal mood and affect. His behavior is normal. Judgment and thought content normal.  Vitals reviewed.   Mallampati Score:  MD Evaluation Airway: WNL Heart: WNL Abdomen: WNL Chest/ Lungs: WNL ASA  Classification: 3 Mallampati/Airway Score: Two  Imaging: Ct Soft Tissue Neck W Contrast  10/21/2014   CLINICAL DATA:  69 year old male with left oral tongue carcinoma diagnosed in 2015. Restaging. Subsequent encounter.  History of graft for partial tongue replacement. Left side neck pain.  EXAM: CT NECK WITH CONTRAST  TECHNIQUE: Multidetector CT imaging of the neck was performed using the standard protocol following the bolus administration of intravenous contrast.  CONTRAST:  14mL OMNIPAQUE IOHEXOL 300 MG/ML SOLN in conjunction with contrast enhanced imaging of the chest reported separately.  COMPARISON:  Pretreatment neck CT 01/04/2014. Chest CT from today reported separately.  FINDINGS: Pharynx and larynx: No discrete pharyngeal or laryngeal mass. There is generalized pharyngeal mucosal space soft tissue thickening which may be the sequelae of XRT. Previously-seen lingual tonsil hypertrophy has regressed.  However, there is a large round heterogeneously enhancing mass involving the intrinsic muscles of the oral tongue and extending caudally into the left sublingual space, measuring up to 48 x 35 x 47 mm (AP by transverse by CC). This is much larger than the 28 mm lesion seen on the 2015 CT. The right sublingual space appears spared.  This enhancing mass extends to abut the inner cortex, but there is no of the left mandible definite mandible erosion. However, there has been symphysis ORIF with and ununited midline mandible symphysis fracture. Perhaps this is postoperative in nature rather than pathologic. Superimposed diffuse mandible and maxillary tooth extraction  changes are also new since 2015.  Salivary glands: Both submandibular glands now appear to be surgically absent with bilateral submandibular surgical clips. The sublingual space is abnormal and described above.  Bilateral parotid glands are within normal limits.  Thyroid: Stable, negative.  Lymph nodes: No discrete cervical lymphadenopathy is identified. Obscuration of the bilateral cervical soft tissue planes compatible with prior XRT.  Vascular: Major vascular structures in the neck and at the skullbase remain patent. The right IJ is again noted to be dominant.  Limited intracranial: Negative.  Visualized orbits: Visualized orbit soft tissues are within normal limits.  Mastoids and visualized paranasal sinuses: Mild right ethmoid and sphenoid sinus mucosal thickening.  Skeleton: Abnormal symphysis of the mandible, described above. Cervical spine degeneration appears stable. No other acute or suspicious osseous lesion identified in the neck.  Upper chest: Reported separately.  IMPRESSION: 1. Progression of disease with large 4.8 cm enhancing mass affecting the intrinsic muscles of the left tongue and extending to the left sublingual space. The mass abuts the inner cortex of the left mandible, but without CT evidence of direct bone invasion. 2. Mandible symphysis ORIF without solid osseous union. This might be postoperative in nature rather  than due to a pathologic fracture. 3. Sequelae of neck XRT and submandibular gland resection. No lymphadenopathy identified. 4. Chest CT from today reported separately.   Electronically Signed   By: Genevie Ann M.D.   On: 10/21/2014 21:14   Ct Chest W Contrast  10/21/2014   CLINICAL DATA:  Tonsillar cancer  EXAM: CT CHEST WITH CONTRAST  TECHNIQUE: Multidetector CT imaging of the chest was performed during intravenous contrast administration.  CONTRAST:  143mL OMNIPAQUE IOHEXOL 300 MG/ML  SOLN  COMPARISON:  12/18/2013  FINDINGS: Mediastinum: Heart size is normal. No pericardial  effusion. Aortic atherosclerosis noted. Calcification within the LAD coronary artery also noted. The trachea appears patent and is midline. Normal appearance of the esophagus. No mediastinal or hilar adenopathy.  Lungs/Pleura: No pleural effusions. There is advanced changes of centrilobular and paraseptal emphysema. No airspace consolidation or atelectasis. No suspicious pulmonary nodule or mass.  Upper Abdomen: The visualized portions of the liver and spleen are normal. The adrenal glands are both normal. Bilateral kidney stones identified. 1.8 cm indeterminate high attenuation structure arises from the upper pole of the right kidney.  Musculoskeletal: There are no aggressive lytic or sclerotic bone lesions.  IMPRESSION: 1. No acute cardiopulmonary abnormalities. 2. No specific features identified to suggest metastatic disease. 3. Emphysema 4. Aortic atherosclerosis 5. Hyper attenuating structure arising from the upper pole of right kidney may represent a hyperdense cyst or solid enhancing neoplasm. More definitive assessment with contrast enhanced cross-sectional imaging is advised. The study of choice would be an MRI.   Electronically Signed   By: Kerby Moors M.D.   On: 10/21/2014 16:29    Labs:  CBC: No results for input(s): WBC, HGB, HCT, PLT in the last 8760 hours.  COAGS: No results for input(s): INR, APTT in the last 8760 hours.  BMP:  Recent Labs  10/21/14 1429  BUN 25.5  CREATININE 1.3    LIVER FUNCTION TESTS: No results for input(s): BILITOT, AST, ALT, ALKPHOS, PROT, ALBUMIN in the last 8760 hours.  TUMOR MARKERS: No results for input(s): AFPTM, CEA, CA199, CHROMGRNA in the last 8760 hours.  Assessment and Plan:  Dx oral tongue sq cell cancer 2015 Now with recurrence Scheduled for tongue mass biopsy Risks and Benefits discussed with the patient including, but not limited to bleeding, infection, damage to adjacent structures or low yield requiring additional tests. All of  the patient's questions were answered, patient is agreeable to proceed. Consent signed and in chart.   Thank you for this interesting consult.  I greatly enjoyed meeting Sean Hopkins and look forward to participating in their care.  A copy of this report was sent to the requesting provider on this date.  Signed: Liberti Appleton A 10/30/2014, 1:17 PM   I spent a total of  30 Minutes   in face to face in clinical consultation, greater than 50% of which was counseling/coordinating care for tongue mass bx

## 2014-10-30 NOTE — Sedation Documentation (Signed)
O2 d/c'd 

## 2014-10-30 NOTE — Procedures (Signed)
[place] core biopsy L submandibular 20S x3 No complication No blood loss. See complete dictation in [facility].

## 2014-10-30 NOTE — Discharge Instructions (Signed)

## 2014-10-30 NOTE — Telephone Encounter (Signed)
Oncology Nurse Navigator Documentation   Navigator Encounter Type: Telephone (10/30/14 1109)        Per Dr. Isidore Moos, I contacted Dr. Servando Salina office requesting a follow-up appt approx 5 days after patient's bx conducted today.  Spoke with Leroy Sea who indicated appt can be arranged for next Monday, 11/04/14, they will contact patient.  Dr. Isidore Moos notified.  Gayleen Orem, RN, BSN, [place] at [place] [phone]

## 2014-11-01 ENCOUNTER — Other Ambulatory Visit: Payer: Self-pay | Admitting: Radiation Oncology

## 2014-11-01 DIAGNOSIS — C023 Malignant neoplasm of anterior two-thirds of tongue, part unspecified: Secondary | ICD-10-CM

## 2014-11-01 NOTE — Progress Notes (Signed)
Informed patient by phone that biopsy is positive for cancer.  Referring to Dr Alvy Bimler to discuss systemic therapy.  He sees ENT at [facility] next week as well.     -----------------------------------  Sean Gibson, MD

## 2014-11-04 ENCOUNTER — Telehealth: Payer: Self-pay | Admitting: *Deleted

## 2014-11-04 NOTE — Telephone Encounter (Signed)
CALLED PATIENT TO INFORM OF APPT. TO SEE DR. Alvy Bimler ON 11/06/14, LVM FOR A RETURN CALL

## 2014-11-05 ENCOUNTER — Encounter: Payer: Self-pay | Admitting: *Deleted

## 2014-11-05 ENCOUNTER — Telehealth: Payer: Self-pay | Admitting: *Deleted

## 2014-11-05 NOTE — Progress Notes (Signed)
[place] Work  Clinical Social Work was referred by Pension scheme manager for assessment of psychosocial needs.  Clinical Social Worker contacted patient by phone to offer support and assess for needs.  Mr. Fails reported that he's "not doing great".  Patient was unable to verbalize what was concerned him, but agreed that it was physical symptoms.  CSW encouraged patient to share concerns with medical oncologist tomorrow.  Patient stated he was not aware of appointment and would be unable to attend this appointment.  Mr. Barrell reported no other concerns at this time.  CSW briefly explained CSW role, CSW met with patient at [facility] and [facility] originally.    CSW notified Gayleen Orem that patient would be unable to attend appointment with medical oncologist tomorrow.   Polo Riley, MSW, LCSW, OSW-C Clinical Social Worker [facility] [phone]

## 2014-11-05 NOTE — Telephone Encounter (Signed)
  Oncology Nurse Navigator Documentation   Navigator Encounter Type: Telephone (11/05/14 1336)       Called patient in follow-up to call from Polo Riley, LCSW who stated patient not coming to tomorrow's appt with Dr. Alvy Bimler.  He confirmed he has appt at [facility] with Dr. Nicolette Bang this afternoon, wanted to cancel appt with Dr. Alvy Bimler "b/c I don't know what he's going to say".  I encouraged Sean Hopkins to keep appt pending his discussion with Dr. Nicolette Bang, he agreed.  I indicated I would call him tomorrow morning for update.  He verbalized understanding.   Gayleen Orem, RN, BSN, [place] at [place] [phone]'

## 2014-11-06 ENCOUNTER — Ambulatory Visit: Payer: Medicare Other

## 2014-11-06 ENCOUNTER — Ambulatory Visit: Payer: Medicare Other | Admitting: Hematology and Oncology

## 2014-11-06 ENCOUNTER — Telehealth: Payer: Self-pay | Admitting: *Deleted

## 2014-11-06 NOTE — Telephone Encounter (Signed)
  Oncology Nurse Navigator Documentation   Patient returned by VM from earlier this morning, provided update s/p yesterday's appt with Dr. Nicolette Bang.  He stated: Dr. Nicolette Bang is presenting his case at meeting today for further discussion.  Dr. Nicolette Bang will follow-up with him later this week, he agreed to contact me with Dr. Servando Salina recommendation. Is cancelling appt with Dr. Alvy Bimler for today.      Gayleen Orem, RN, BSN, [place] at [place] [phone]

## 2014-11-27 ENCOUNTER — Telehealth: Payer: Self-pay | Admitting: *Deleted

## 2014-11-27 NOTE — Telephone Encounter (Signed)
  Oncology Nurse Navigator Documentation   Navigator Encounter Type: Telephone (11/27/14 1000)      LVM for patient on home phone requesting update on treatment plan with Dr. Nicolette Bang.  Per my conversation with patient wife on 8/15, he had morning appt with Nicolette Bang.  Patient did not return my 8/16 home phone VMs x2 for update.  Unable to LVM on mobile.  Gayleen Orem, RN, BSN, [place] at [place] [phone]

## 2014-12-03 ENCOUNTER — Telehealth: Payer: Self-pay | Admitting: *Deleted

## 2014-12-03 NOTE — Telephone Encounter (Signed)
Oncology Nurse Navigator Documentation   Navigator Encounter Type: Telephone (12/03/14 1330)      Called patient for update s/p his consults with Dr. Nicolette Bang, [facility].  Patient unable to speak, spoke with [facility] RN who was present conducting initial assessment.  She informed patient has 8/25 and 9/2 appts at [facility] to discuss chemo/RT, now has PEG.  I called Dr. Servando Salina office, spoke with Roanna Epley.  She informed patient had PEG place s/p a previous appt with Dr. Nicolette Bang d/t weight loss, has been referred to [facility] for clinical trial.  He has a swallowing study scheduled tomorrow at [facility]  Dr. Isidore Moos informed.  Gayleen Orem, RN, BSN, [place] at [place] [phone]

## 2014-12-07 ENCOUNTER — Emergency Department
Admission: EM | Admit: 2014-12-07 | Discharge: 2014-12-08 | Disposition: A | Discharge status: Home / Self Care | Payer: Medicare Other | Attending: Emergency Medicine | Admitting: Emergency Medicine

## 2014-12-07 ENCOUNTER — Encounter: Payer: Self-pay | Admitting: Emergency Medicine

## 2014-12-07 ENCOUNTER — Emergency Department: Payer: Medicare Other

## 2014-12-07 DIAGNOSIS — Z87891 Personal history of nicotine dependence: Secondary | ICD-10-CM | POA: Insufficient documentation

## 2014-12-07 DIAGNOSIS — Z85810 Personal history of malignant neoplasm of tongue: Secondary | ICD-10-CM | POA: Insufficient documentation

## 2014-12-07 DIAGNOSIS — Y833 Surgical operation with formation of external stoma as the cause of abnormal reaction of the patient, or of later complication, without mention of misadventure at the time of the procedure: Secondary | ICD-10-CM | POA: Insufficient documentation

## 2014-12-07 DIAGNOSIS — K9423 Gastrostomy malfunction: Secondary | ICD-10-CM | POA: Insufficient documentation

## 2014-12-07 DIAGNOSIS — G8929 Other chronic pain: Secondary | ICD-10-CM | POA: Diagnosis not present

## 2014-12-07 HISTORY — DX: Malignant neoplasm of tongue, unspecified: C02.9

## 2014-12-07 NOTE — ED Provider Notes (Signed)
[facility] Emergency Department Provider Note  ____________________________________________  Time seen: Approximately 10:43 PM  I have reviewed the triage vital signs and the nursing notes.   HISTORY  Chief Complaint Other  Limited by the patient's inability to speak well status post tongue and tonsillar cancer  HPI Sean Hopkins is a 69 y.o. male with a comp to get a history of tongue and tonsillar cancer status post radiation and surgery who has a PEG tube for nutrition.  He presents tonight because his PEG tube came out.  He put it back in himself but the balloon was down.  He is not in any acute pain and is having no other issues at this time.He does not know why the tube came out because he does not remember pulling on it but he was able to put it back in with no difficulties so it sounds as if the balloon is gone.   Past Medical History  Diagnosis Date  . 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 History of radiation therapy 12/17/20116-05/10/2014    tongue/bilateral neck/60 Gy/30 fx to higher risk regions/ 54 Gy /30 fx to intermediate risk regions  . Tonsillar cancer   . Tongue cancer     Patient Active Problem List   Diagnosis Date Noted  . 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 Tongue cancer 03/13/2014    Past Surgical History  Procedure Laterality Date  . Hernia repair    . 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 Gastrostomy tube placement      Current Outpatient Rx  Name  Route  Sig  Dispense  Refill  . fluconazole (DIFLUCAN) 100 MG tablet      Take 2 tablets today, then 1 tablet daily x 20 more days.   22 tablet   0   . HYDROcodone-acetaminophen (NORCO) 5-325 MG per tablet   Oral   Take 1 tablet by mouth every 6 (six) hours as needed for moderate pain.          Marland Kitchen ibuprofen (ADVIL,MOTRIN) 200 MG tablet   Oral   Take 800 mg by mouth every 6 (six) hours as needed for moderate  pain.         Marland Kitchen lidocaine (XYLOCAINE) 2 % solution      Patient: Mix 1part 2% viscous lidocaine, 1part H20. Swish and/or swallow 60mL of this mixture, 20min before meals and at bedtime, up to QID Patient not taking: Reported on 06/05/2014   100 mL   5   . terazosin (HYTRIN) 1 MG capsule   Oral   Take 1 mg by mouth.           Allergies Review of patient's allergies indicates not on file.  No family history on file.  Social History Social History  Substance Use Topics  . Smoking status: Former Smoker -- 20 years  . Smokeless tobacco: Not on file     Comment: Stopped smoking at age 42  . Alcohol Use: 0.0 oz/week    0 Standard drinks or equivalent per week     Comment: Social    Review of Systems Constitutional: No fever/chills ENT: Chronic pain in his mouth and throat Cardiovascular: Denies chest pain. Respiratory: Denies shortness of breath. Gastrointestinal: No abdominal pain.  Dislodged PEG tube Musculoskeletal: Negative for back pain. Skin: Negative for rash. Neurological: Negative for headaches, focal weakness or numbness.  ____________________________________________   PHYSICAL EXAM:  VITAL SIGNS:  ED Triage Vitals  Enc Vitals Group     BP 12/07/14 2212 133/69 mmHg     Pulse Rate 12/07/14 2212 94     Resp 12/07/14 2212 18     Temp 12/07/14 2212 98.3 F (36.8 C)     Temp Source 12/07/14 2212 Oral     SpO2 12/07/14 2212 99 %     Weight 12/07/14 2212 140 lb (63.504 kg)     Height 12/07/14 2212 5\' 6"  (1.676 m)     Head Cir --      Peak Flow --      Pain Score 12/07/14 2219 0     Pain Loc --      Pain Edu? --      Excl. in [place]? --     Constitutional: Alert and oriented.  Has the appearance of chronic illness but in no acute distress. Eyes: Conjunctivae are normal. PERRL. EOMI. Head: Atraumatic. Nose: No congestion/rhinnorhea. Mouth/Throat: Mucous membranes are moist.  Oropharynx non-erythematous. Neck: No stridor.   Cardiovascular: Normal rate,  regular rhythm. Grossly normal heart sounds.  Good peripheral circulation. Respiratory: Normal respiratory effort.  No retractions. Lungs CTAB. Gastrointestinal: Soft and nontender.  Thin.  No distention. No abdominal bruits. No CVA tenderness.  Well-appearing surgical scars.  The patient's PEG tube is currently in place but that is because he replaced it himself. Musculoskeletal: No lower extremity tenderness nor edema.  No joint effusions. Neurologic:  No gross focal neurologic deficits are appreciated.  Skin:  Skin is warm, dry and intact. No rash noted.   ____________________________________________   LABS (all labs ordered are listed, but only abnormal results are displayed)  Not indicated ____________________________________________  EKG  Not indicated ____________________________________________  RADIOLOGY   Pending  ____________________________________________   PROCEDURES  Procedure(s) performed: Replaced PEG tube, see procedure note(s). [facility] Course)   Critical Care performed: No ____________________________________________   INITIAL IMPRESSION / ASSESSMENT AND PLAN / ED COURSE  Pertinent labs & imaging results that were available during my care of the patient were reviewed by me and considered in my medical decision making (see chart for details).  I replaced the patient's PEG tube as per the tube's instructions.  I verified in the original surgery note that the tube placed was an 48 [place].  I replaced the tube with a Kangaroo gastrostomy feeding tube with Y Ports 6-mL/18-Fr tube.  There were no complications and the patient tolerated the procedure well.  I told him and his wife that should follow-up with his surgeon on Monday to see when they would like to see him for a follow-up appointment.    Dr. Cinda Quest will follow up with radiology interpretation prior to discharge.  I gave my usual and customary return  precautions.   ____________________________________________  FINAL CLINICAL IMPRESSION(S) / ED DIAGNOSES  Final diagnoses:  PEG tube malfunction      NEW MEDICATIONS STARTED DURING THIS VISIT:  New Prescriptions   No medications on file     Hinda Kehr, MD 12/08/14 [phone]

## 2014-12-07 NOTE — ED Notes (Signed)
Pt says his G-tube came out tonight while at home; denies pain

## 2014-12-08 MED ORDER — DIATRIZOATE MEGLUMINE 30 % UR SOLN
Freq: Once | URETHRAL | Status: DC | PRN
  Administered 2014-12-08: 60 mL
  Filled 2014-12-08: qty 300

## 2014-12-08 MED ORDER — DIATRIZOATE MEGLUMINE 30 % UR SOLN: Freq: Once | URETHRAL | Status: DC | PRN

## 2014-12-08 NOTE — ED Notes (Signed)
Additional warm blankets provided. Pt updated on wait time for xray results.

## 2014-12-08 NOTE — ED Notes (Signed)
Pt lying in bed in no acute distress. Male at bedside. md in to assess pt. Call bell at side.

## 2014-12-08 NOTE — Discharge Instructions (Signed)
We replaced your PEG tube today with one of different style but the same diameter.  Please follow-up with your surgeon at the next available opportunity to see when they would like to see you in follow-up.  Return immediately to the nearest emergency department if you develop new or worsening symptoms that concern you.   Gastric Tube Replacement You are having your gastric tube (the tube that goes into the stomach) changed. This is usually a very minor procedure. If medications are prescribed, take them as directed. Only take over-the-counter or prescription medications for pain, discomfort, or fever as directed by your caregiver.  SEEK IMMEDIATE MEDICAL CARE IF:   You develop chills, fever, or show signs of generalized illness.  You develop bleeding around the tube.  Your new tube does not seem to be working properly.  You are unable to get feedings into the tube.  Your tube comes out for any reason. Document Released: 12/22/2000 Document Revised: 06/21/2011 Document Reviewed: 03/29/2005 [facility] Patient Information 2015 [place], [place]. This information is not intended to replace advice given to you by your health care provider. Make sure you discuss any questions you have with your health care provider.  Care of a Feeding Tube People who have trouble swallowing or cannot take food or medicine by mouth are sometimes given feeding tubes. A feeding tube can go into the nose and down to the stomach or through the skin in the abdomen and into the stomach or small bowel. Some of the names of these feeding tubes are gastrostomy tubes, PEG lines, nasogastric tubes, and gastrojejunostomy tubes.  SUPPLIES NEEDED TO CARE FOR THE TUBE SITE  Clean gloves.  Clean wash cloth, gauze pads, or soft paper towel.  Cotton swabs.  Skin barrier ointment or cream.  Soap and water.  Pre-cut foam pads or gauze (that go around the tube).  Tube tape. TUBE SITE CARE  Have all supplies ready and  available.  Wash hands well.  Put on clean gloves.  Remove the soiled foam pad or gauze, if present, that is found under the tube stabilizer. Change the foam pad or gauze daily or when soiled or moist.  Check the skin around the tube site for redness, rash, swelling, drainage, or extra tissue growth. If you notice any of these, call your caregiver.  Moisten gauze and cotton swabs with water and soap.  Wipe the area closest to the tube (right near the stoma) with cotton swabs. Wipe the surrounding skin with moistened gauze. Rinse with water.  Dry the skin and stoma site with a dry gauze pad or soft paper towel. Do not use antibiotic ointments at the tube site.  If the skin is red, apply a skin barrier cream or ointment (such as petroleum jelly) in a circular motion, using a cotton swab. The cream or ointment will provide a moisture barrier for the skin and helps with wound healing.  Apply a new pre-cut foam pad or gauze around the tube. Secure it with tape around the edges. If no drainage is present, foam pads or gauze may be left off.  Use tape or an anchoring device to fasten the feeding tube to the skin for comfort or as directed. Rotate where you tape the tube to avoid skin damage from the adhesive.  Position the person in a semi-upright position (30-45 degree angle).  Throw away used supplies.  Remove gloves.  Wash hands. SUPPLIES NEEDED TO FLUSH A FEEDING TUBE  Clean gloves.  60 mL syringe (that  connects to the feeding tube).  Towel.  Water. FLUSHING A FEEDING TUBE   Have all supplies ready and available.  Wash hands well.  Put on clean gloves.  Draw up 30 mL of water in the syringe.  Kink the feeding tube while disconnecting it from the feeding-bag tubing or while removing the plug at the end of the tube. Kinking closes the tube and prevents secretions in the tube from spilling out.  Insert the tip of the syringe into the end of the feeding tube. Release the  kink. Slowly inject the water.  If unable to inject the water, the person with the feeding tube should lay on his or her left side. The tip of the tube may be against the stomach wall, blocking fluid flow. Changing positions may move the tip away from the stomach wall. After repositioning, try injecting the water again.  After injecting the water, remove the syringe.  Always flush before giving the first medicine, between medicines, and after the final medicine before starting a feeding. This prevents medicines from clogging the tube.  Throw away used supplies.  Remove gloves.  Wash hands. Document Released: 03/29/2005 Document Revised: 03/15/2012 Document Reviewed: 11/11/2011 [facility] Patient Information 2015 [place], [place]. This information is not intended to replace advice given to you by your health care provider. Make sure you discuss any questions you have with your health care provider.  PEG, Home Care You had a percutaneous endoscopic gastrostomy (PEG) tube put in your stomach. Do not put anything in your feeding tube until you have been shown how to use it. HOME CARE For the first 24 hours:  Do not sign any important or legal papers.  Do not drive. Every day:  Check the place where the tube goes into your belly. See your doctor if the tube site is red, puffy (swollen), or smells bad.  Clean around the tube with soap and water. Dry the area by patting it gently with a clean towel.  Do not put a bandage around the tube site. Keep the area dry.  You should turn the bumper on the outside of your belly. The bumper should lie flat against your skin so you are comfortable.  Do not put whole pills through the tube. Instead, crush and dissolve all pills in warm water before placing down tube. GET HELP RIGHT AWAY IF:  You throw up (vomit).  The pain in your belly gets worse.  You have trouble breathing.  You have a temperature by mouth above 102 F (38.9 C), not controlled  by medicine.  You have pain when you swallow.  You have chest pain.  There is green or yellow fluid (drainage) around the tube site that smells bad.  There is redness and puffiness around the tube site.  The tube comes out. MAKE SURE YOU:  Understand these instructions.  Will watch your condition.  Will get help right away if you are not doing well or get worse. Document Released: 05/01/2010 Document Revised: 11/29/2012 Document Reviewed: 09/28/2012 [facility] Patient Information 2015 [place], [place]. This information is not intended to replace advice given to you by your health care provider. Make sure you discuss any questions you have with your health care provider.

## 2014-12-19 ENCOUNTER — Encounter (HOSPITAL_COMMUNITY): Payer: Self-pay | Admitting: Emergency Medicine

## 2014-12-19 ENCOUNTER — Emergency Department (INDEPENDENT_AMBULATORY_CARE_PROVIDER_SITE_OTHER)
Admission: EM | Admit: 2014-12-19 | Discharge: 2014-12-19 | Disposition: A | Discharge status: Home / Self Care | Payer: Medicare Other | Source: Home / Self Care | Attending: Family Medicine | Admitting: Family Medicine

## 2014-12-19 DIAGNOSIS — C029 Malignant neoplasm of tongue, unspecified: Secondary | ICD-10-CM

## 2014-12-19 DIAGNOSIS — N390 Urinary tract infection, site not specified: Secondary | ICD-10-CM | POA: Diagnosis not present

## 2014-12-19 LAB — POCT URINALYSIS DIP (DEVICE)
Bilirubin Urine: NEGATIVE
GLUCOSE, UA: NEGATIVE mg/dL
KETONES UR: NEGATIVE mg/dL
Nitrite: NEGATIVE
PROTEIN: NEGATIVE mg/dL
SPECIFIC GRAVITY, URINE: 1.015 (ref 1.005–1.030)
UROBILINOGEN UA: 0.2 mg/dL (ref 0.0–1.0)
pH: 7 (ref 5.0–8.0)

## 2014-12-19 MED ORDER — CEPHALEXIN 250 MG/5ML PO SUSR: 500.0000 mg | Freq: Four times a day (QID) | ORAL | Status: AC

## 2014-12-19 NOTE — ED Provider Notes (Signed)
CSN: [id number]     Arrival date & time 12/19/14  1914 History   First MD Initiated Contact with Patient 12/19/14 2018     Chief Complaint  Patient presents with  . 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 His wife does most of the speaking for him. He is complaining of gross hematuria, dysuria and urinary frequency for 2 days. He has urine has been dark. There have been no reported fevers. There was some question as to whether he had some confusion but is not quite clear how to describe it. There was also periods of agitation. He been in the hospital this past Monday to Wednesday and discharged yesterday to have a gastric tube inserted. Currently is sitting in a wheelchair he is alert, attentive and apparently understands the questions and conversation in the room.his speech is such that is difficult to understand.    Past Medical History  Diagnosis Date  . 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 History of radiation therapy 12/17/20116-05/10/2014    tongue/bilateral neck/60 Gy/30 fx to higher risk regions/ 54 Gy /30 fx to intermediate risk regions  . Tonsillar cancer   . Tongue cancer    Past Surgical History  Procedure Laterality Date  . Hernia repair    . 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 Gastrostomy tube placement     No family history on file. Social History  Substance Use Topics  . Smoking status: Former Smoker -- 20 years  . Smokeless tobacco: None     Comment: Stopped smoking at age 71  . Alcohol Use: 0.0 oz/week    0 Standard drinks or equivalent per week     Comment: Social    Review of Systems  Constitutional: Negative for fever, activity change and fatigue.  HENT:  Negative.   Respiratory: Negative for cough and shortness of breath.   Gastrointestinal: Negative.   Genitourinary: Positive for dysuria, frequency and hematuria. Negative for flank pain, discharge, scrotal swelling and testicular pain.  Musculoskeletal: Negative.   Skin: Negative.   Psychiatric/Behavioral: Positive for confusion and agitation.    Allergies  Benadryl  Home Medications   Prior to Admission medications   Medication Sig Start Date End Date Taking? Authorizing Provider  ondansetron (ZOFRAN) 8 MG tablet Take by mouth every 8 (eight) hours as needed for nausea or vomiting.   Yes Historical Provider, MD  oxycodone (OXY-IR) 5 MG capsule Take 5 mg by mouth every 4 (four) hours as needed.   Yes Historical Provider, MD  cephALEXin (KEFLEX) 250 MG/5ML suspension Take 10 mLs (500 mg total) by mouth 4 (four) times daily. 12/19/14   Janne Napoleon, NP  fluconazole (DIFLUCAN) 100 MG tablet Take 2 tablets today, then 1 tablet daily x 20 more days. 10/11/14   Eppie Gibson, MD  HYDROcodone-acetaminophen (NORCO) 5-325 MG per tablet Take 1 tablet by mouth every 6 (six) hours as needed for moderate pain.  10/07/14   Historical Provider, MD  ibuprofen (ADVIL,MOTRIN) 200 MG tablet Take 800 mg by mouth every 6 (six) hours as needed for moderate pain.    Historical Provider, MD  lidocaine (XYLOCAINE) 2 % solution Patient: Mix 1part 2% viscous lidocaine, 1part H20. Swish and/or swallow 71mL of this mixture, 61min before meals and at  bedtime, up to QID Patient not taking: Reported on 06/05/2014 04/15/14   Eppie Gibson, MD  terazosin (HYTRIN) 1 MG capsule Take 1 mg by mouth. 02/22/14   Historical Provider, MD   Meds Ordered and Administered this Visit  Medications - No data to display  BP 114/70 mmHg  Pulse 97  Temp(Src) 98.6 F (37 C) (Oral)  Resp 97  SpO2 98% No data found.   Physical Exam  Constitutional: He appears well-developed and well-nourished. No distress.  Eyes: EOM are normal.  Neck:  Neck supple.  Cardiovascular: Normal rate.   Pulmonary/Chest: Effort normal. No respiratory distress.  Neurological: He is alert.  Skin: Skin is warm and dry. No erythema.  Psychiatric: He has a normal mood and affect. His behavior is normal.  Nursing note and vitals reviewed.   ED Course  Procedures (including critical care time)  Labs Review Labs Reviewed  POCT URINALYSIS DIP (DEVICE) - Abnormal; Notable for the following:    Hgb urine dipstick LARGE (*)    Leukocytes, UA TRACE (*)    All other components within normal limits   Results for orders placed or performed during the hospital encounter of 12/19/14  POCT urinalysis dip (device)  Result Value Ref Range   Glucose, UA NEGATIVE NEGATIVE mg/dL   Bilirubin Urine NEGATIVE NEGATIVE   Ketones, ur NEGATIVE NEGATIVE mg/dL   Specific Gravity, Urine 1.015 1.005 - 1.030   Hgb urine dipstick LARGE (A) NEGATIVE   pH 7.0 5.0 - 8.0   Protein, ur NEGATIVE NEGATIVE mg/dL   Urobilinogen, UA 0.2 0.0 - 1.0 mg/dL   Nitrite NEGATIVE NEGATIVE   Leukocytes, UA TRACE (A) NEGATIVE    Imaging Review No results found.   Visual Acuity Review  Right Eye Distance:   Left Eye Distance:   Bilateral Distance:    Right Eye Near:   Left Eye Near:    Bilateral Near:         MDM   1. UTI (lower urinary tract infection)   2. Tongue cancer      Increase clear fluid delivery via gastric tube, may use Pedialyte to supplement. Keflex liquid per feeding tube Urine culture pencding     Janne Napoleon, NP 12/19/14 2044

## 2014-12-19 NOTE — ED Notes (Signed)
Per wife, pt c/o poss UTI onset 2-3 days Sx include dark urine, hematuria, and disoriented Alert... No acute distress.

## 2014-12-19 NOTE — Discharge Instructions (Signed)
Urinary Tract Infection                 Increase clear fluid delivery via gastric tube, may use Pedialyte to supplement. Keflex liquid per feeding tube   Urinary tract infections (UTIs) can develop anywhere along your urinary tract. Your urinary tract is your body's drainage system for removing wastes and extra water. Your urinary tract includes two kidneys, two ureters, a bladder, and a urethra. Your kidneys are a pair of bean-shaped organs. Each kidney is about the size of your fist. They are located below your ribs, one on each side of your spine. CAUSES Infections are caused by microbes, which are microscopic organisms, including fungi, viruses, and bacteria. These organisms are so small that they can only be seen through a microscope. Bacteria are the microbes that most commonly cause UTIs. SYMPTOMS  Symptoms of UTIs may vary by age and gender of the patient and by the location of the infection. Symptoms in young women typically include a frequent and intense urge to urinate and a painful, burning feeling in the bladder or urethra during urination. Older women and men are more likely to be tired, shaky, and weak and have muscle aches and abdominal pain. A fever may mean the infection is in your kidneys. Other symptoms of a kidney infection include pain in your back or sides below the ribs, nausea, and vomiting. DIAGNOSIS To diagnose a UTI, your caregiver will ask you about your symptoms. Your caregiver also will ask to provide a urine sample. The urine sample will be tested for bacteria and white blood cells. White blood cells are made by your body to help fight infection. TREATMENT  Typically, UTIs can be treated with medication. Because most UTIs are caused by a bacterial infection, they usually can be treated with the use of antibiotics. The choice of antibiotic and length of treatment depend on your symptoms and the type of bacteria causing your infection. HOME CARE INSTRUCTIONS  If you were  prescribed antibiotics, take them exactly as your caregiver instructs you. Finish the medication even if you feel better after you have only taken some of the medication.  Drink enough water and fluids to keep your urine clear or pale yellow.  Avoid caffeine, tea, and carbonated beverages. They tend to irritate your bladder.  Empty your bladder often. Avoid holding urine for long periods of time.  Empty your bladder before and after sexual intercourse.  After a bowel movement, women should cleanse from front to back. Use each tissue only once. SEEK MEDICAL CARE IF:   You have back pain.  You develop a fever.  Your symptoms do not begin to resolve within 3 days. SEEK IMMEDIATE MEDICAL CARE IF:   You have severe back pain or lower abdominal pain.  You develop chills.  You have nausea or vomiting.  You have continued burning or discomfort with urination. MAKE SURE YOU:   Understand these instructions.  Will watch your condition.  Will get help right away if you are not doing well or get worse. Document Released: 01/06/2005 Document Revised: 09/28/2011 Document Reviewed: 05/07/2011 [facility] Patient Information 2015 [place], [place]. This information is not intended to replace advice given to you by your health care provider. Make sure you discuss any questions you have with your health care provider.

## 2014-12-20 LAB — URINE CULTURE
Culture: 3000
SPECIAL REQUESTS: NORMAL

## 2014-12-24 NOTE — ED Notes (Signed)
Final report of UA C&S negative, no further action required

## 2015-01-11 DEATH — deceased

## 2015-02-28 ENCOUNTER — Ambulatory Visit: Admission: RE | Admit: 2015-02-28 | Payer: Medicare Other | Source: Ambulatory Visit | Admitting: Radiation Oncology

## 2015-02-28 ENCOUNTER — Ambulatory Visit
Admission: RE | Admit: 2015-02-28 | Discharge: 2015-02-28 | Disposition: A | Discharge status: Home / Self Care | Payer: Medicare Other | Source: Ambulatory Visit | Attending: Radiation Oncology | Admitting: Radiation Oncology

## 2015-03-25 NOTE — Therapy (Signed)
[facility] [address] [place], [place], [address] Phone: [phone]   Fax:  [phone]  Patient Details  Name: Sean Hopkins MRN: [id number] Date of Birth: 24-Nov-1945 Referring Provider:  No ref. provider found  Encounter Date: 03/25/2015  SPEECH THERAPY DISCHARGE SUMMARY  Visits from Start of Care: one  Current functional level related to goals / functional outcomes: Pt did not return to speech therapy after initial evaluation. SLP is unsure of reason for this. No follow up appointments were scheduled.   Remaining deficits: Unknown, as pt was not seen after initial evaluation.   Education / Equipment: HEP for dysphagia, late effects of radiation on swallowing.  Plan: Patient agrees to discharge.  Patient goals were not met. Patient is being discharged due to not returning since the last visit.  ?????       [place] , MS, CCC-SLP   03/25/2015, 1:52 PM  [facility] [address] [place], [place], [address] Phone: [phone]   Fax:  [phone]

## 2015-07-08 ENCOUNTER — Encounter: Payer: Self-pay | Admitting: *Deleted

## 2015-07-08 NOTE — Progress Notes (Signed)
News & Record, January 03, 2015  [birth year]an VANGENDERENApr 08, 2069, died peacefully on 2015-01-07.

## 2016-07-17 IMAGING — US US BIOPSY
1 series · 8 of 8 positions shown · non-contrast
Comparison: none

CLINICAL DATA: Left sublingual gland carcinoma, post resection with
placement of forearm free flap in the resection bed. Follow-up CT
demonstrates 4.8 cm peripherally enhancing lesion at the base of the
tongue on the left extending to the sublingual space.

EXAM:
ULTRASOUND-GUIDED CORE LEFT SUBLINGUAL BIOPSY
TECHNIQUE: An ultrasound guided biopsy was thoroughly discussed with the
patient and questions were answered. The benefits, risks,
alternatives, and complications were also discussed. The patient
understands and wishes to proceed with the procedure. A verbal as
well as written consent was obtained.

[Series 1: us biopsy · 0.08mm/px · 8 of 8 slices shown]
[im 1/8]
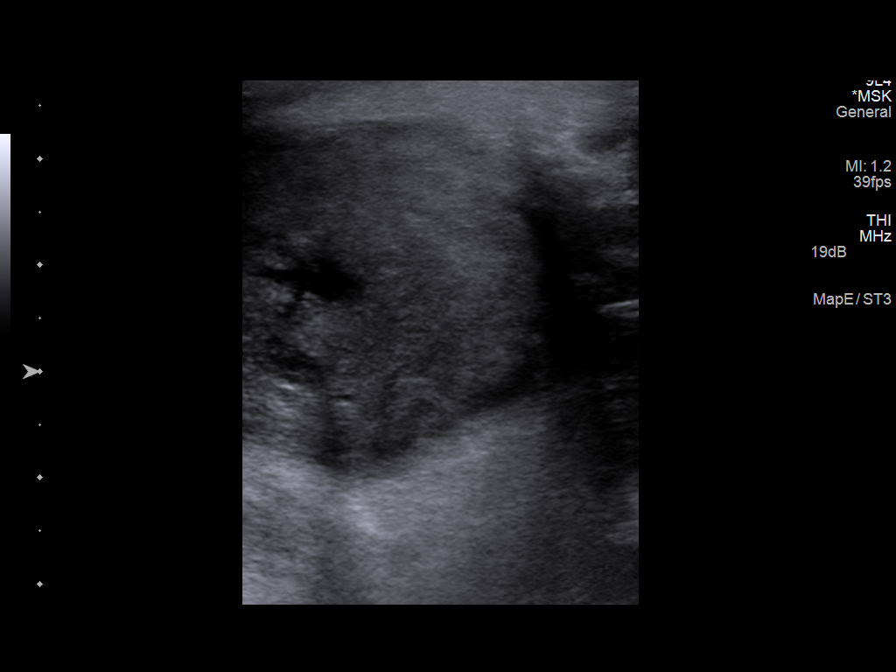
[im 2/8]
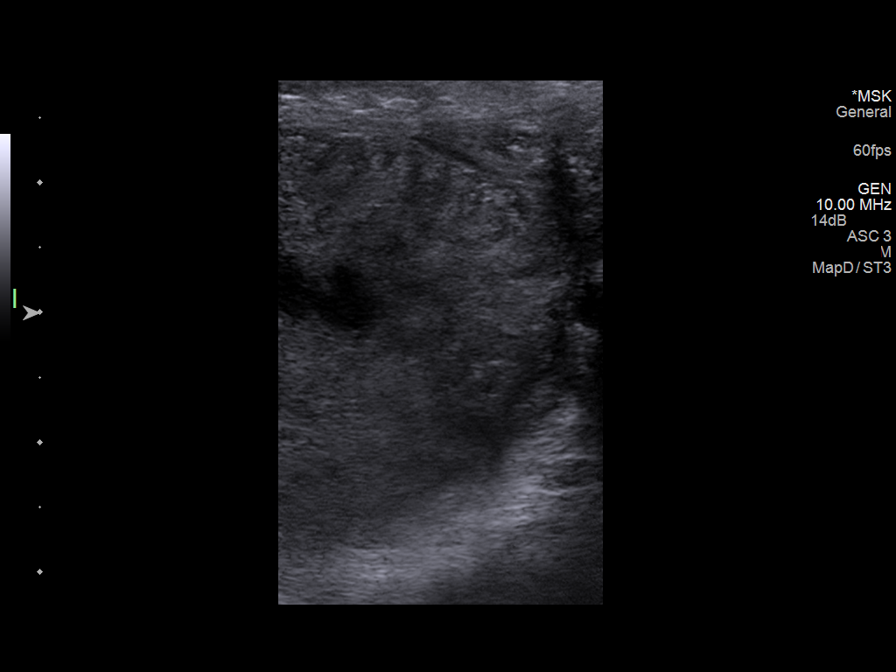
[im 3/8]
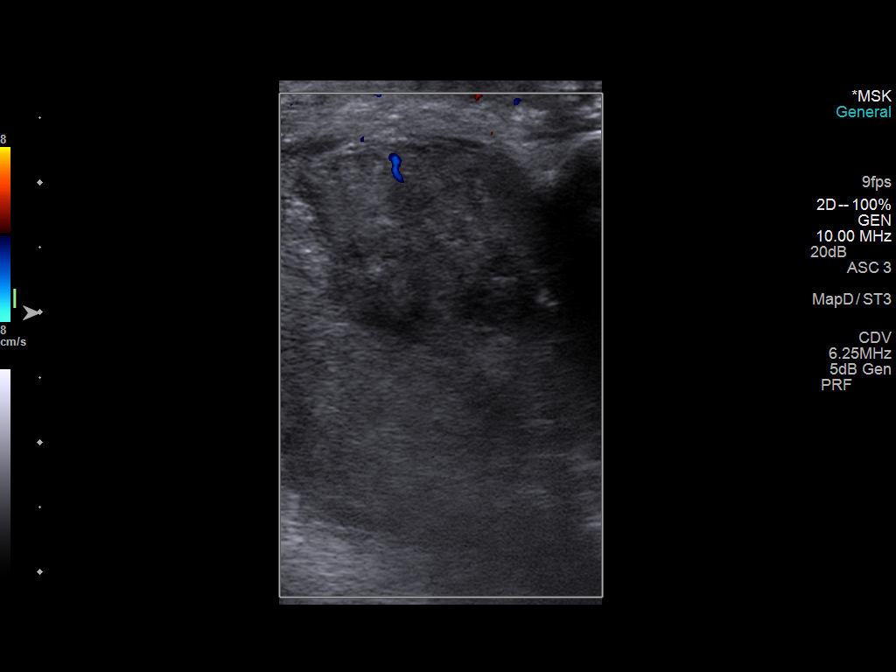
[im 4/8]
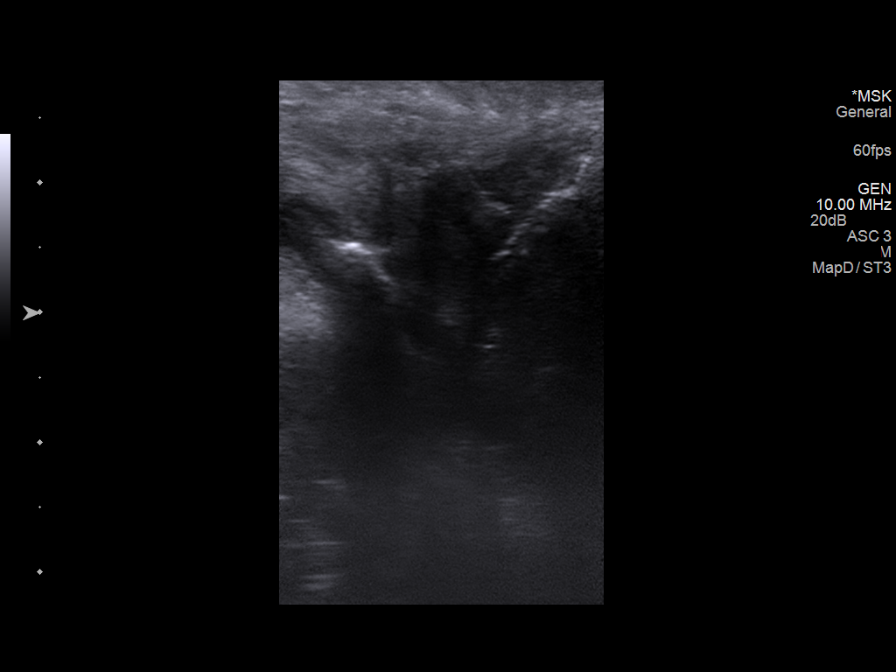
[im 5/8]
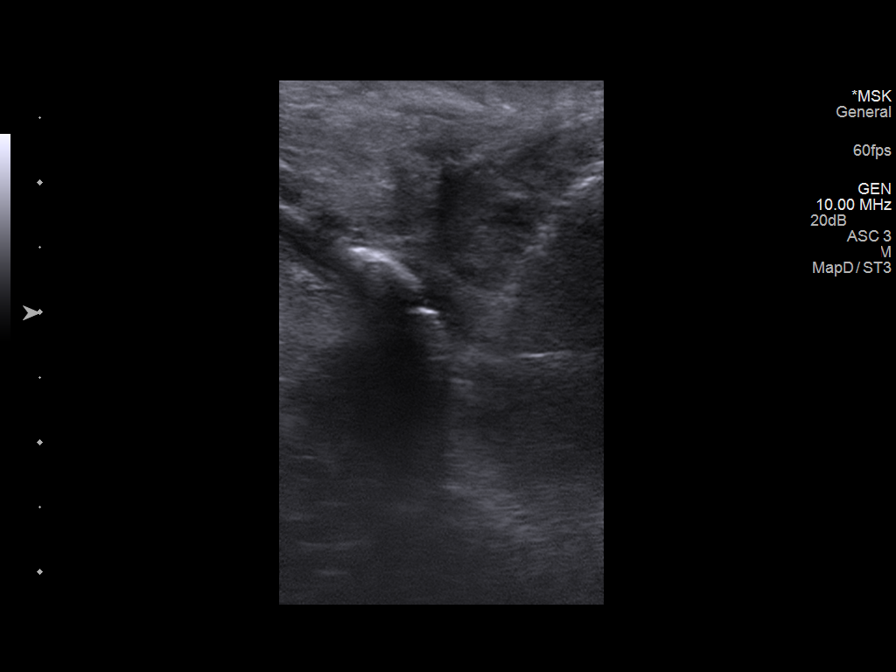
[im 6/8]
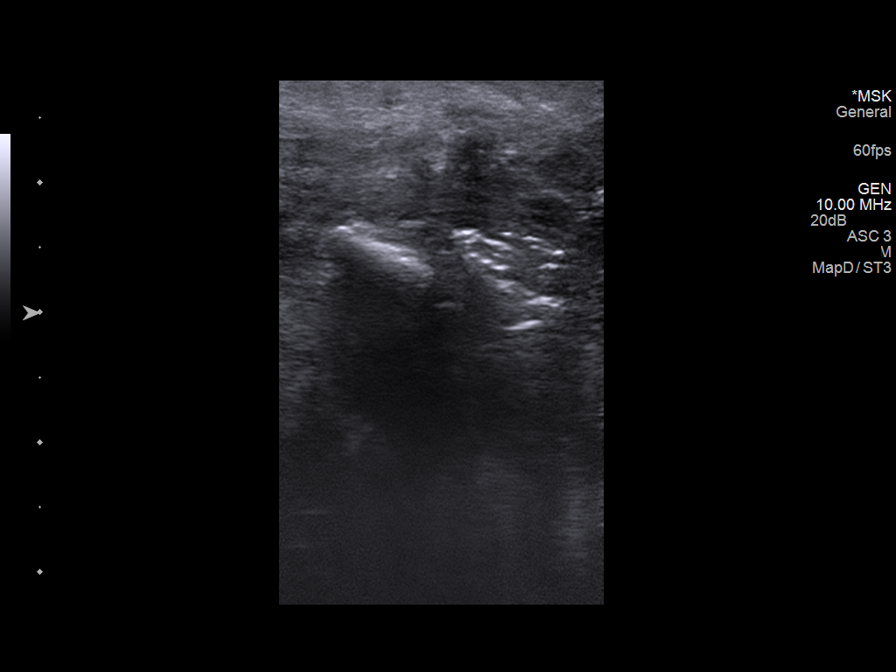
[im 7/8]
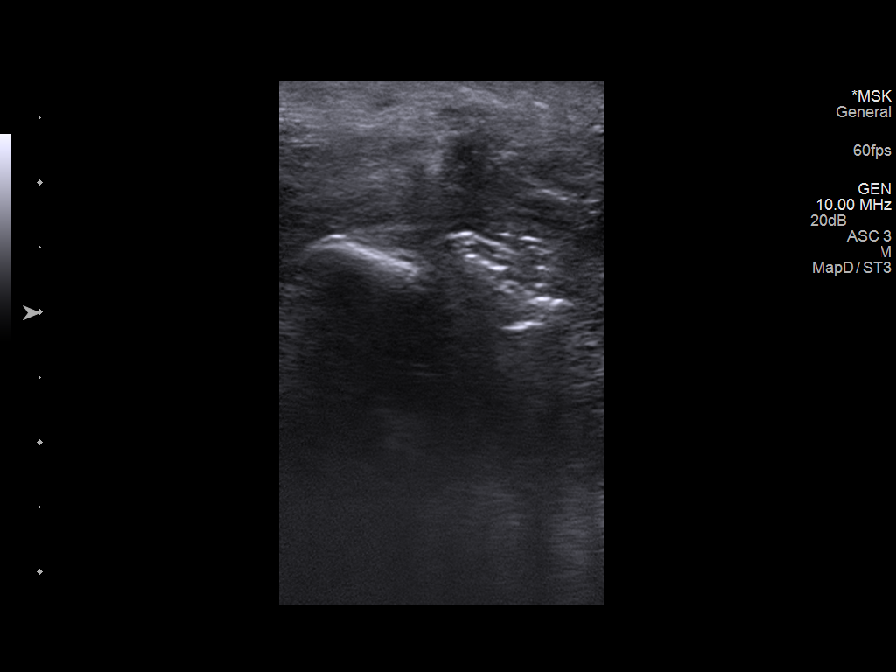
[im 8/8]
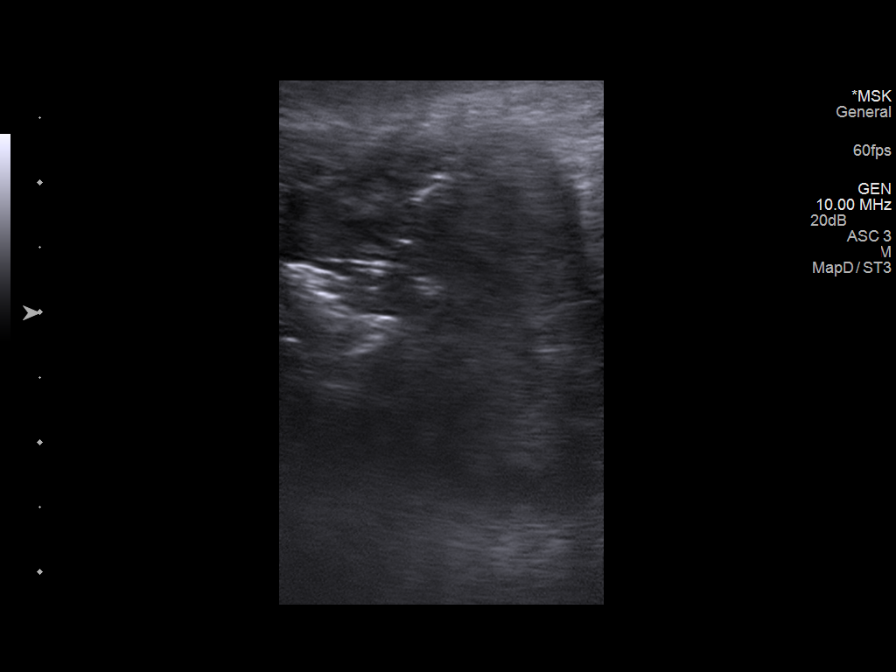

[8 of 8 positions shown; findings below may reference images not displayed]

Survey ultrasound of the LEFT SUBLINGUAL REGION was performed,
lesion localized, and an appropriate skin entry site was determined.
Skin site was marked, prepped with Betadine, and draped in usual
sterile fashion, and infiltrated locally with 1% lidocaine.

Intravenous Fentanyl and Versed were administered as conscious
sedation during continuous cardiorespiratory monitoring by the
radiology RN, with a total moderate sedation time of 5 minutes.

A 17 gauge trocar needle was advanced under ultrasound guidance to
the margin of the lesion from a submandibular approach. 3 coaxial
83gauge core samples were then obtained through the guide needle,
sampling peripheral and central components of the lesion. The guide
needle was removed. Post procedure scans demonstrate no apparent
complication.

COMPLICATIONS:
COMPLICATIONS
None immediate
FINDINGS: Survey ultrasound demonstrates solid-appearing fairly well-defined
mass in the submandibular/sublingual space. Some low level color
Doppler flow within the lesion is noted peripherally. Core biopsy
samples were obtained as above.
IMPRESSION: 1. Technically successful ultrasound guided core biopsy left
sublingual mass.

## 2016-11-22 IMAGING — CT CT NECK W/ CM
3 of 12 series · 7 of 33 positions shown, 8 images · IV contrast (OMNIPAQUE)
Comparison: Pretreatment neck CT 01/04/2014. Chest CT from today
reported separately.

CLINICAL DATA: 69-year-old male with left oral tongue carcinoma
diagnosed in 6354. Restaging. Subsequent encounter.

History of graft for partial tongue replacement. Left side neck
pain.
EXAM:
CT NECK WITH CONTRAST
TECHNIQUE: Multidetector CT imaging of the neck was performed using the
standard protocol following the bolus administration of intravenous
contrast.
CONTRAST:  100mL OMNIPAQUE IOHEXOL 300 MG/ML SOLN in conjunction
with contrast enhanced imaging of the chest reported separately.

[Series 603: sag · sagittal · 0.74mm/px · 3 of 101 slices shown]
[im 26/101  bone]
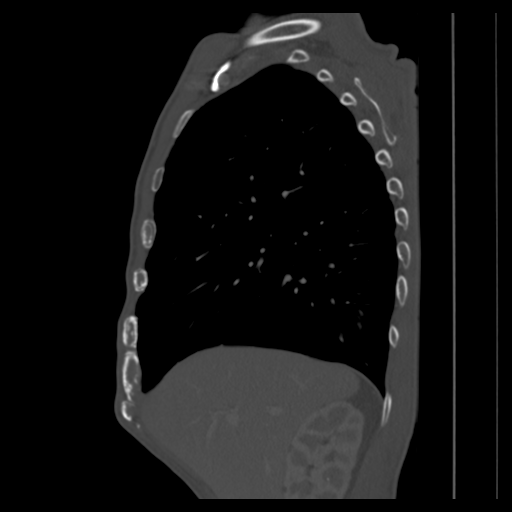
[im 51/101  bone]
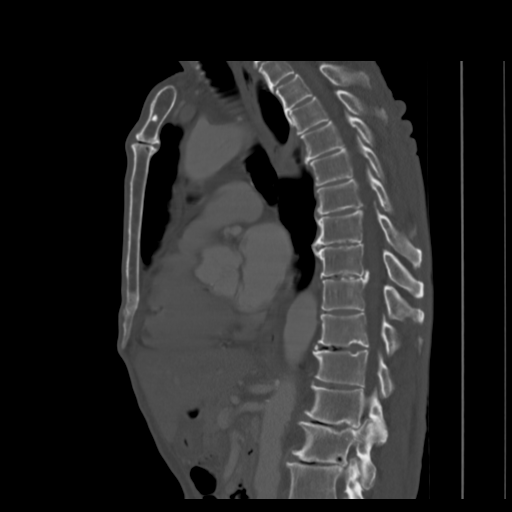
[im 76/101  bone]
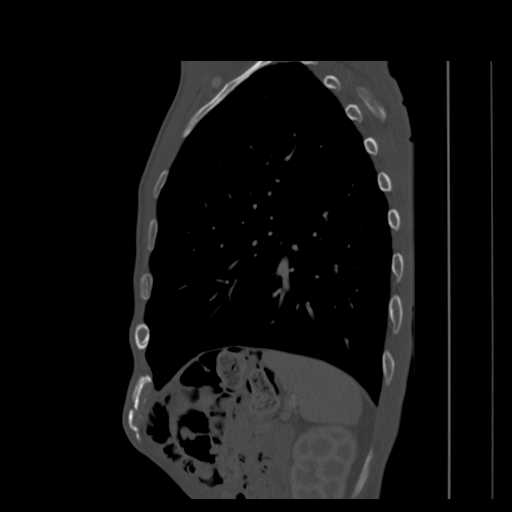

[Series 607: axial reformats · axial · 0.49mm/px · z∈[-327,-43]mm · 3 of 146 slices shown, 4 images]
[im 1/146  soft-tissue]
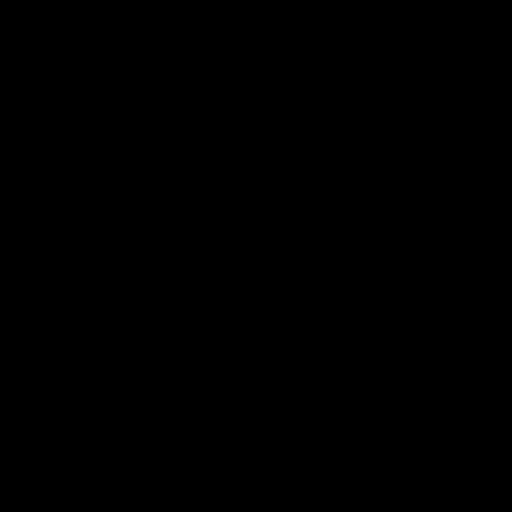
[im 1/146  bone]
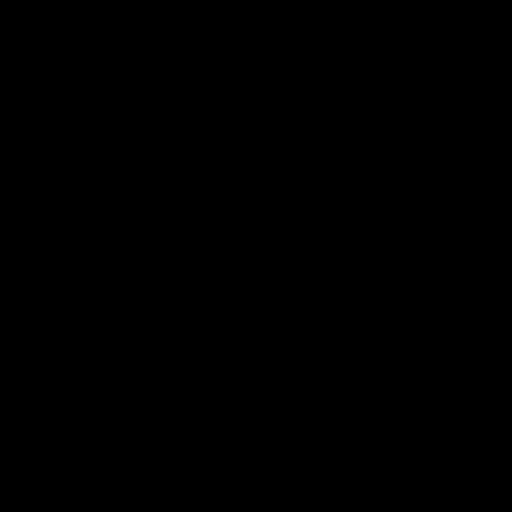
[im 73/146  bone]
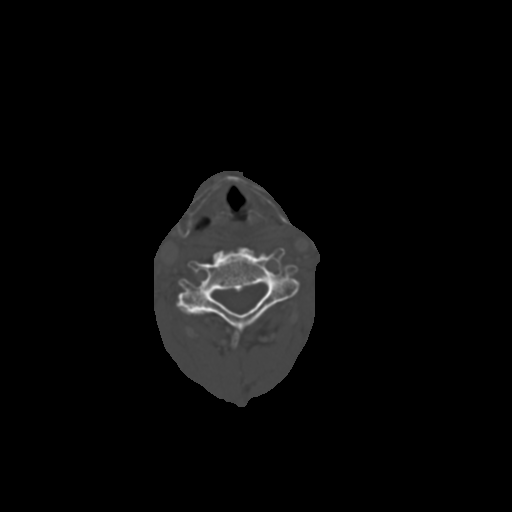
[im 146/146  bone]
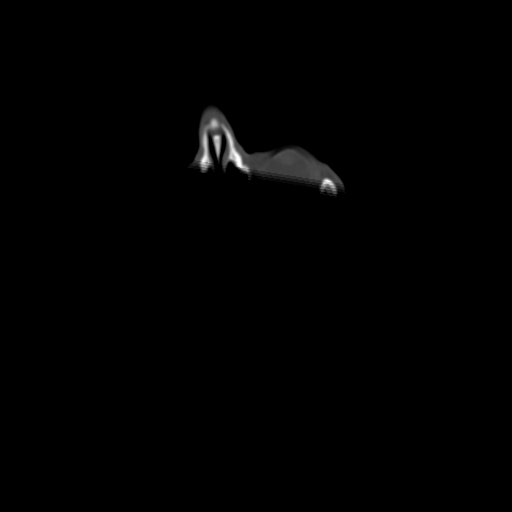

[Series 608: coronal · coronal · 0.49mm/px · 1 of 96 slices shown]
[im 48/96  bone]
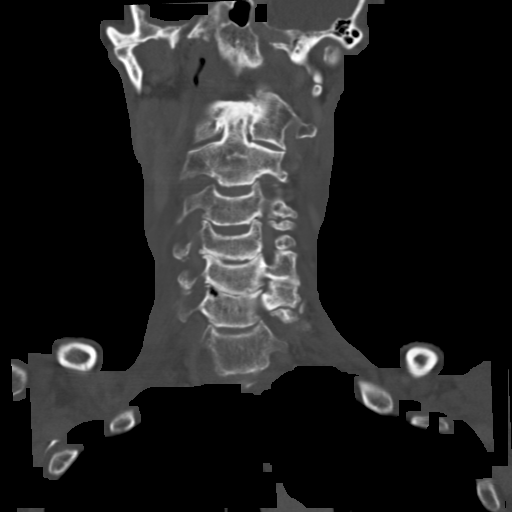

[7 of 33 positions shown; findings below may reference images not displayed]

FINDINGS: Pharynx and larynx: No discrete pharyngeal or laryngeal mass. There
is generalized pharyngeal mucosal space soft tissue thickening which
may be the sequelae of XRT. Previously-seen lingual tonsil
hypertrophy has regressed.

However, there is a large round heterogeneously enhancing mass
involving the intrinsic muscles of the oral tongue and extending
caudally into the left sublingual space, measuring up to 48 x 35 x
47 mm (AP by transverse by CC). This is much larger than the 28 mm
lesion seen on the 6354 CT. The right sublingual space appears
spared.

This enhancing mass extends to abut the inner cortex, but there is
no of the left mandible definite mandible erosion. However, there
has been symphysis ORIF with and ununited midline mandible symphysis
fracture. Perhaps this is postoperative in nature rather than
pathologic. Superimposed diffuse mandible and maxillary tooth
extraction changes are also new since 6354.

Salivary glands: Both submandibular glands now appear to be
surgically absent with bilateral submandibular surgical clips. The
sublingual space is abnormal and described above.

Bilateral parotid glands are within normal limits.

Thyroid: Stable, negative.

Lymph nodes: No discrete cervical lymphadenopathy is identified.
Obscuration of the bilateral cervical soft tissue planes compatible
with prior XRT.

Vascular: Major vascular structures in the neck and at the skullbase
remain patent. The right IJ is again noted to be dominant.

Limited intracranial: Negative.

Visualized orbits: Visualized orbit soft tissues are within normal
limits.

Mastoids and visualized paranasal sinuses: Mild right ethmoid and
sphenoid sinus mucosal thickening.

Skeleton: Abnormal symphysis of the mandible, described above.
Cervical spine degeneration appears stable. No other acute or
suspicious osseous lesion identified in the neck.

Upper chest: Reported separately.
IMPRESSION: 1. Progression of disease with large 4.8 cm enhancing mass affecting
the intrinsic muscles of the left tongue and extending to the left
sublingual space. The mass abuts the inner cortex of the left
mandible, but without CT evidence of direct bone invasion.
2. Mandible symphysis ORIF without solid osseous union. This might
be postoperative in nature rather than due to a pathologic fracture.
3. Sequelae of neck XRT and submandibular gland resection. No
lymphadenopathy identified.
4. Chest CT from today reported separately.

## 2017-01-08 IMAGING — CR DG ABD PORTABLE 1V
1 series · 2 of 2 positions shown · non-contrast
Comparison: None.

CLINICAL DATA: Peg tube replacement with Gastrografin.

EXAM:
PORTABLE ABDOMEN - 1 VIEW

[Series 1: ap · 0.17mm/px · 2 of 2 slices shown]
[im 1/2]
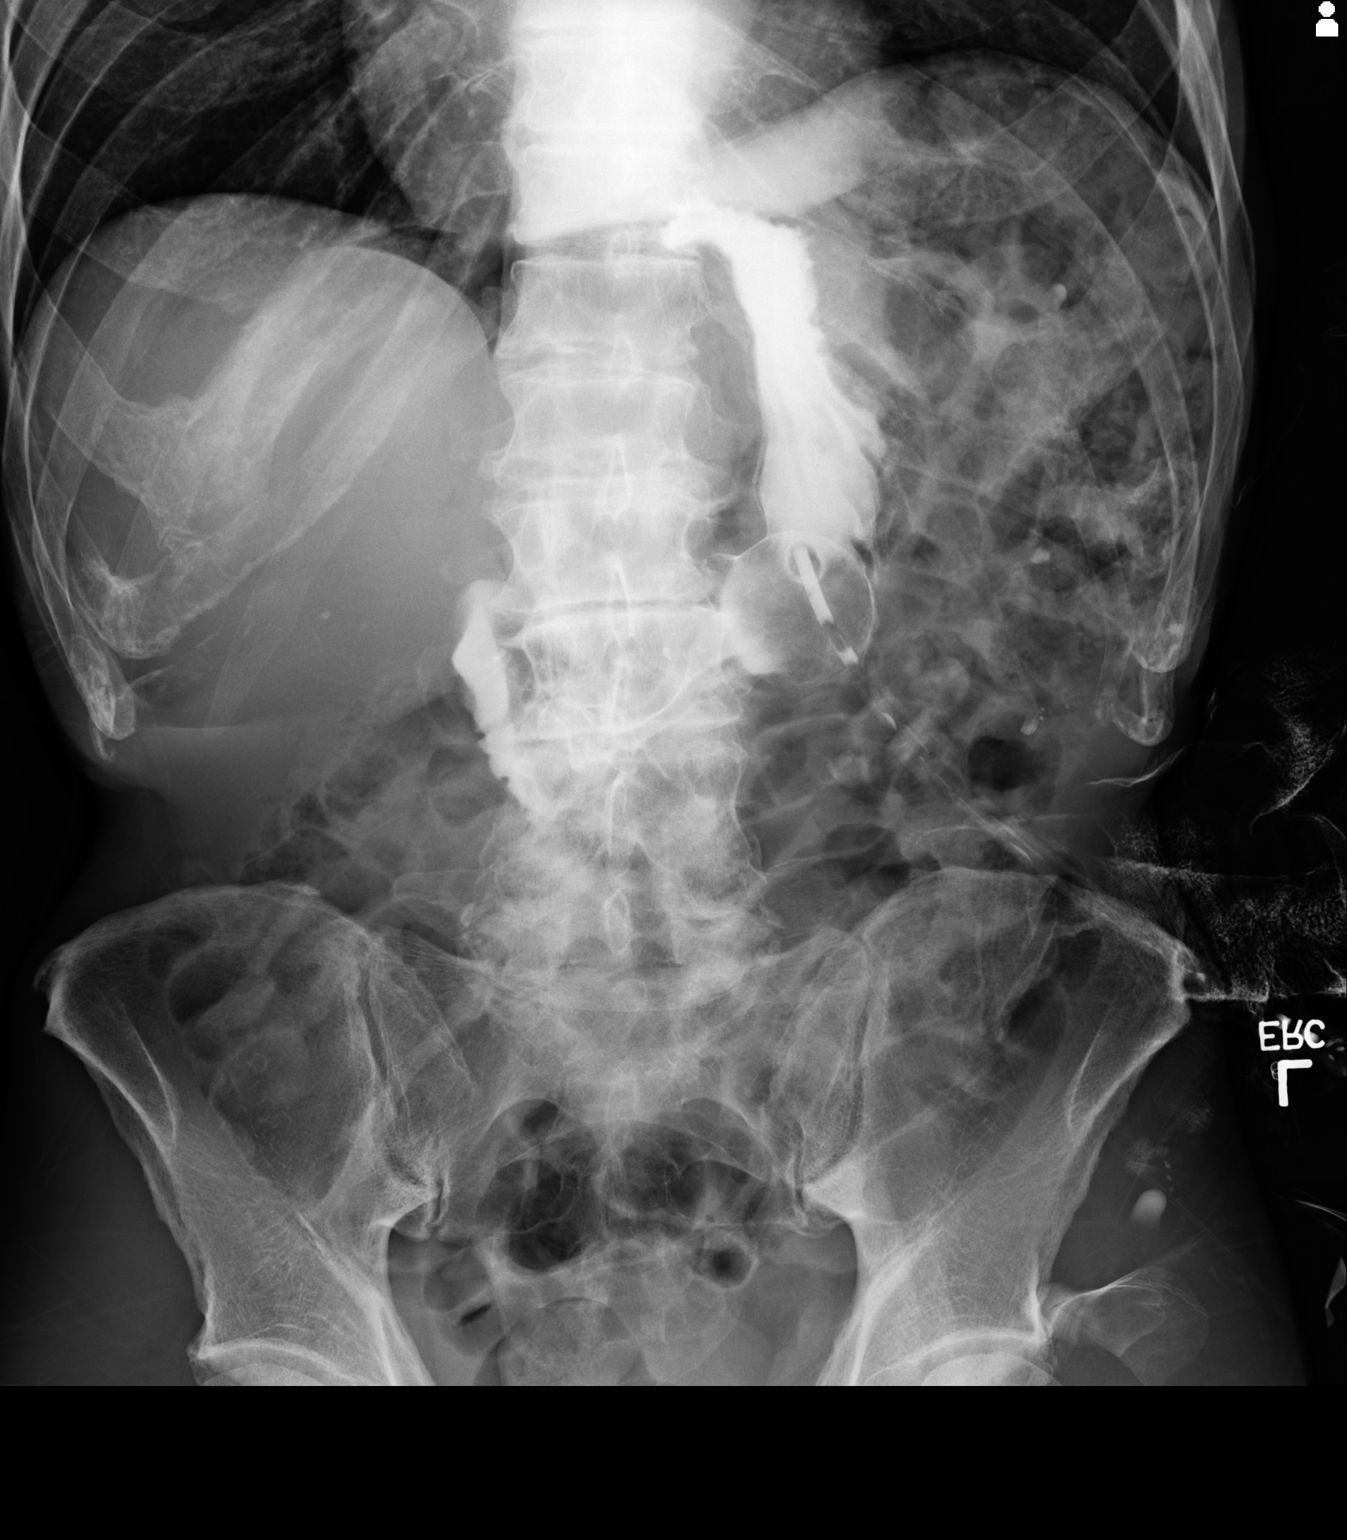
[im 2/2]
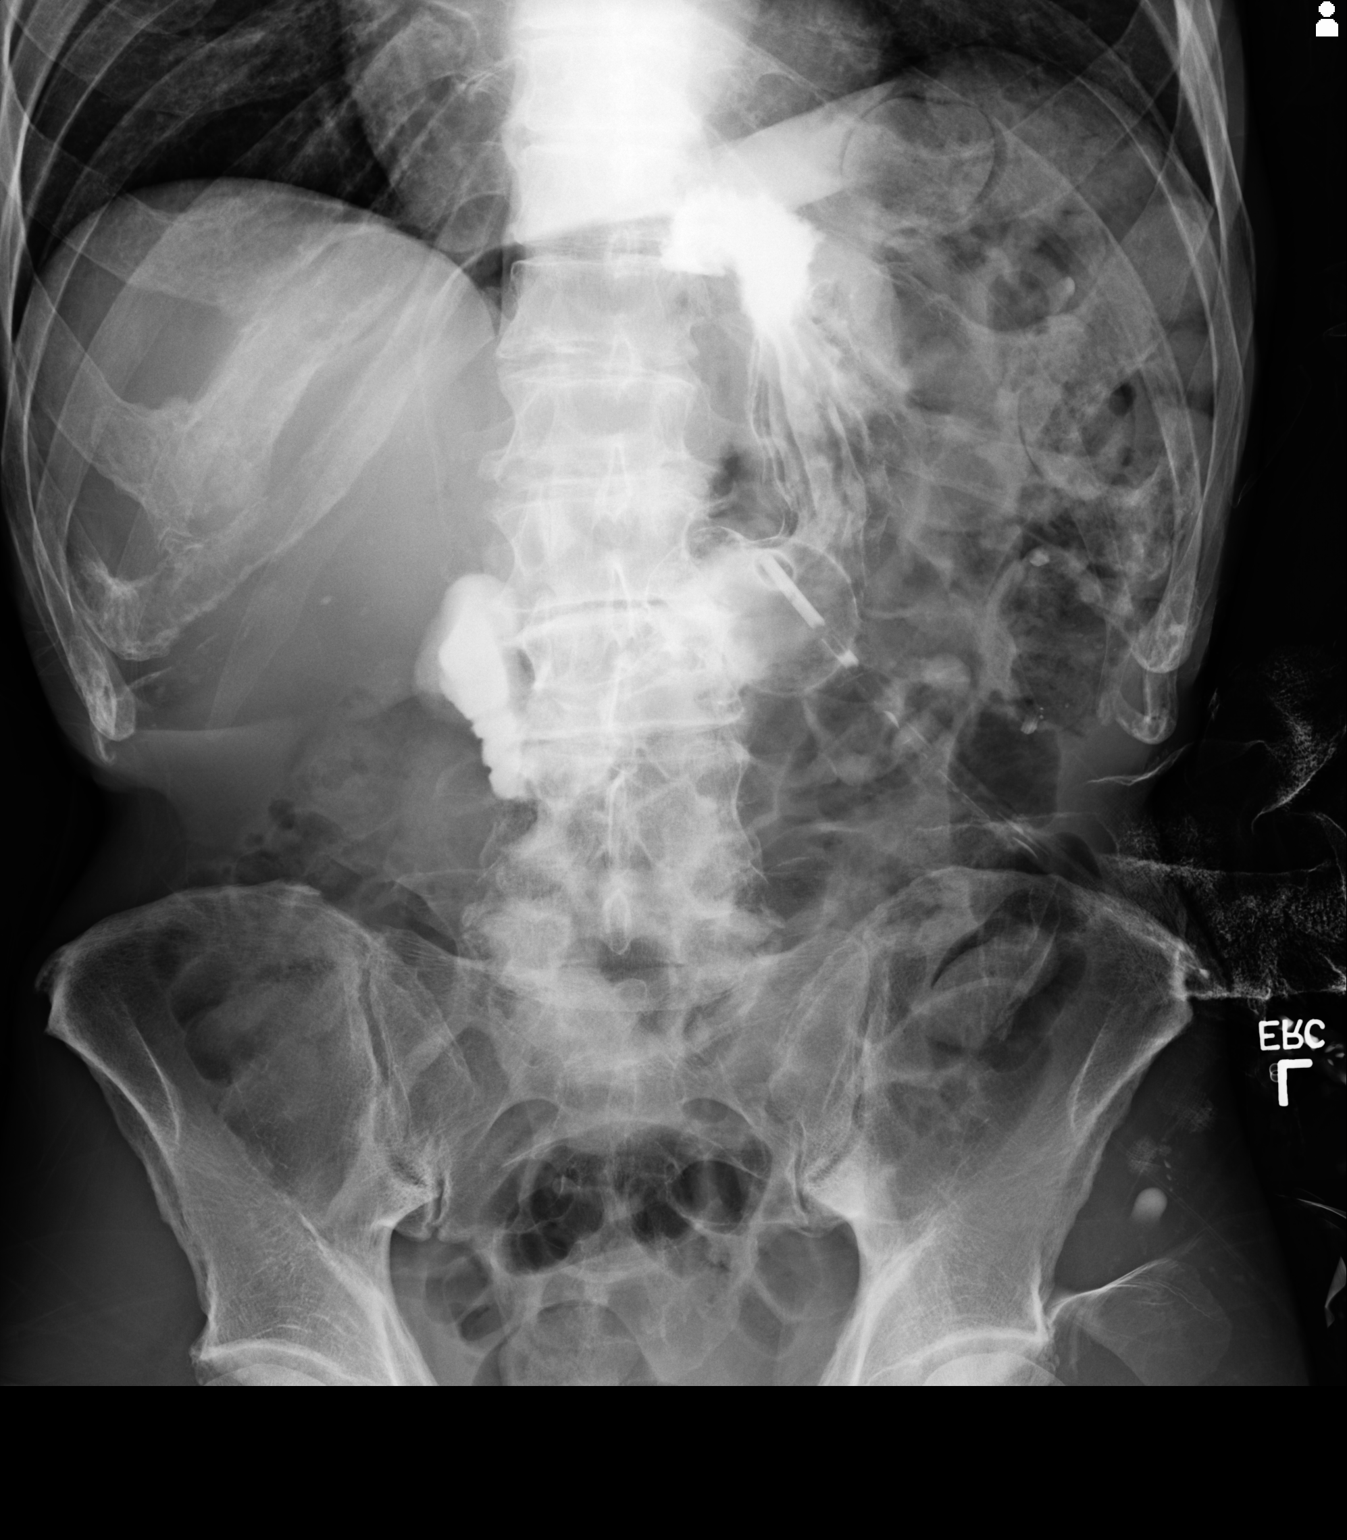

[2 of 2 positions shown; findings below may reference images not displayed]

FINDINGS: Contrast material was apparently injected into a percutaneous
gastrostomy tube. Contrast material is demonstrated in the stomach
and proximal duodenum. The retention balloon is in the body of the
stomach. No contrast extravasation is noted. No small or large bowel
distention.
IMPRESSION: Gastrostomy tube tip appears to be in the stomach.
